# Patient Record
Sex: Female | Born: 1985 | Race: White | Hispanic: No | Marital: Single | Smoking: Current every day smoker
Health system: Southern US, Community
[De-identification: ages and names within clinical notes are randomized; demographics above are authoritative.]

## PROBLEM LIST (undated history)

## (undated) DIAGNOSIS — R569 Unspecified convulsions: Secondary | ICD-10-CM

## (undated) DIAGNOSIS — D649 Anemia, unspecified: Secondary | ICD-10-CM

## (undated) DIAGNOSIS — Z789 Other specified health status: Secondary | ICD-10-CM

## (undated) HISTORY — PX: WISDOM TOOTH EXTRACTION: SHX21

---
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
## 2005-08-18 ENCOUNTER — Emergency Department (HOSPITAL_COMMUNITY): Admission: EM | Admit: 2005-08-18 | Discharge: 2005-08-18 | Payer: Self-pay | Admitting: Emergency Medicine

## 2006-03-30 ENCOUNTER — Emergency Department (HOSPITAL_COMMUNITY): Admission: EM | Admit: 2006-03-30 | Discharge: 2006-03-30 | Payer: Self-pay | Admitting: Emergency Medicine

## 2007-05-20 ENCOUNTER — Ambulatory Visit: Payer: Self-pay | Admitting: Obstetrics & Gynecology

## 2007-05-20 ENCOUNTER — Inpatient Hospital Stay (HOSPITAL_COMMUNITY): Admission: AD | Admit: 2007-05-20 | Discharge: 2007-05-23 | Payer: Self-pay | Admitting: Obstetrics and Gynecology

## 2007-05-21 ENCOUNTER — Encounter: Payer: Self-pay | Admitting: Obstetrics & Gynecology

## 2008-02-20 ENCOUNTER — Emergency Department (HOSPITAL_COMMUNITY): Admission: EM | Admit: 2008-02-20 | Discharge: 2008-02-20 | Payer: Self-pay | Admitting: Emergency Medicine

## 2009-05-26 ENCOUNTER — Emergency Department (HOSPITAL_COMMUNITY): Admission: EM | Admit: 2009-05-26 | Discharge: 2009-05-26 | Payer: Self-pay | Admitting: Emergency Medicine

## 2011-02-24 NOTE — Discharge Summary (Signed)
[name], [name]              ACCOUNT NO.:  [id number]   MEDICAL RECORD NO.:  [id number]          PATIENT TYPE:  INP   LOCATION:  9122                          FACILITY:  [facility]   PHYSICIAN:  Tilda Burrow, M.D. DATE OF BIRTH:  30-Nov-1985   DATE OF ADMISSION:  05/20/2007  DATE OF DISCHARGE:  05/23/2007                               DISCHARGE SUMMARY   ADMITTING DIAGNOSES:  1. Pregnancy at 40 plus 4 weeks.  2. Medically indicated induction of labor.   DISCHARGE DIAGNOSES:  1. Pregnancy 40 weeks.  2. Failure to progress.  3. Asynclitic fetal presentation.   PROCEDURE:  1. May 20, 2007, Foley bulb cervical ripening, Jannifer Franklin, M.D.  2. May 21, 2007, Pitocin induction of labor.  3. May 21, 2007, primary low-transverse cervical cesarean section,      Lesly Dukes, M.D., teaching service.   DISCHARGE MEDICATIONS:  1. Vicodin 5/500, #20, one to two q.4 h. p.r.n. pain.  2. Chromagen forte, #60 tablets, one b.i.d. for 30 days.  3. Motrin 800 mg, #30 tablets, one q.8 h. p.r.n. cramps, refill x1.  4. Prenatal vitamins to be continued.   CONTRACEPTION:  Implanon at 4 weeks.   HOSPITAL SUMMARY:  [name] was admitted at 40 weeks plus 4 for induction  of labor.  The cervix was relatively unfavorable but Foley bulb cervical  ripening could be done.  Stretching cervix to 2-to-3-cm 20%, minus 2.  That was left in through the night and Pitocin was begun at 3 a.m. after  the cervix had reached 2-to-3-cm, 70%, minus 2.  She progressed thorough  the day with very slow progress.  The cervix remained quite fibrotic.  Mechanical stretching was performed with cervical exams.  Unfortunately,  she reached into the evening hours she reached 6-cm dilated, 70%, minus  1 but had no progress beyond that.  She was felt to have asynclitic  presentation.  An amnioinfusion was attempted without success and the  patient began to have continued variable decelerations into the 70s and  80s  with the resumption of Pitocin at 8 p.m.  She was therefore taken to  cesarean section at approximately 9 p.m. by Dr. Penne Lash.  Postpartum course was straightforward and she had a postpartum  hemoglobin 9.5, hematocrit 27.1, compared to  admitting hemoglobin 10.4, and hematocrit 30.  She did excellently  postoperatively with minimal bleeding.  She was tolerating breast-  feeding, bottle supplementation.  She had a female infant.  And, she was  discharged in stable condition for staple removal in 4 days with  Implanon to be decided upon thereafter.      Tilda Burrow, M.D.  Electronically Signed     JVF/MEDQ  D:  05/23/2007  T:  05/23/2007  Job:  [id number]   cc:   [facility]

## 2011-02-24 NOTE — H&P (Signed)
[name], [name]              ACCOUNT NO.:  [id number]   MEDICAL RECORD NO.:  [id number]          PATIENT TYPE:  INP   LOCATION:  9174                          [facility]ACILITY:  [facility]   PHYSICIAN:  Tilda Burrow, M.D. DATE OF BIRTH:  May 24, 1986   DATE OF ADMISSION:  05/20/2007  DATE OF DISCHARGE:                              HISTORY & PHYSICAL   ADMISSION DIAGNOSES:  Pregnancy at 40 weeks and 4 days, medical  induction of labor indicated secondary to impending post-dates.   HISTORY OF PRESENT ILLNESS:  This 26 year old gravida 1, para 0, with  ultrasound in __________ , placing her due date at May 15, 2007, and  with the patient admitted for Foley bulb cervical ripening and Pitocin  induction of labor. The patient has been followed since [redacted] weeks  gestation, September 22, 2006 through our office with uncomplicated  prenatal course, appropriate weight gain. She is admitted for labor  management. She is aware that all the usual risks of labor can occur  with induced labor, including the need for emergency delivery or  assisted vaginal delivery.   PAST MEDICAL HISTORY:  Benign.   PAST SURGICAL HISTORY:  Negative.   ALLERGIES:  Negative.   SOCIAL HISTORY:  Single. Lives alone. Works at [employer]. Supportive  partner, [name]. This is also his first child.   PRENATAL LABORATORY DATA:  Blood type A negative. Urine drug screen  positive for THC. Rubella immunity present. Hemoglobin and hematocrit 13  and 35. Hepatitis, HIV, RPR, and Gonorrhea and Chlamydia all negative.  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  MS AFP normal. Hemoglobin 11 and hematocrit 32. One hour glucose  __________ 119 mg percent.   PHYSICAL EXAMINATION:  VITAL SIGNS:  Height 5'2. Weight 140. Fundal  height 35 to 36 cm. Estimated fetal weight 6 pounds. Cervix fingertip,  20%, minus 2, vertex with Foley bulb inserted at 10:30 a.m.   PLAN:  Foley bulb ripening for 3 hours,  Pitocin induction of labor. Good  prognosis for vaginal delivery.      Tilda Burrow, M.D.  Electronically Signed     JVF/MEDQ  D:  05/20/2007  T:  05/21/2007  Job:  [id number]

## 2011-02-24 NOTE — Op Note (Signed)
[name], [name]              ACCOUNT NO.:  [id number]   MEDICAL RECORD NO.:  [id number]          PATIENT TYPE:  INP   LOCATION:  9174                          [facility]ILITY:  [facility]   PHYSICIAN:  Lesly Dukes, M.D. DATE OF BIRTH:  10/31/1985   DATE OF PROCEDURE:  05/21/2007  DATE OF DISCHARGE:                               OPERATIVE REPORT   PREOPERATIVE DIAGNOSIS:  25 year old G1, P0 at term with  malpresentation, asynclitism and occiput posterior.  The patient had  failure to progress and unable to tolerate Pitocin.   POSTOPERATIVE DIAGNOSIS:  25 year old G1, P0 at term with  malpresentation, asynclitism and occiput posterior.  The patient had  failure to progress and unable to tolerate Pitocin.   OPERATION PERFORMED:  Primary low transverse cesarean section.   SURGEON:  Lesly Dukes, M.D.   ANESTHESIA:  Epidural.   PATHOLOGY:  Placenta to pathology.   ESTIMATED BLOOD LOSS:  800 mL.   COMPLICATIONS:  Mild uterine atony requiring methergine.   FINDINGS:  Viable female infant, Apgars 9 at one minute and 9 at five  minutes.  Vertex asynclitic, LOP.  Head not well engaged.  Grossly  normal uterus, ovaries and fallopian tubes.  Clear fluid.   DESCRIPTION OF PROCEDURE:  After informed consent was obtained, the  patient was taken to the operating room where epidural anesthesia was  found to be adequate.  The patient was placed in dorsal supine position  with a leftward tilt and prepared and draped in the normal sterile  fashion.  A Foley catheter was already in the bladder.  A Pfannenstiel  skin incision was made with a scalpel and carried down to the fascia.  The fascia was incised in the midline and extended bilaterally with the  Mayo scissors.  The superior and inferior aspects of the fascial  incision were grasped with Kocher clamps, tented up, and dissected up  sharply and bluntly to the underlying layers of rectus muscles.  The  rectus muscles were separated in the  midline.  The peritoneum was  entered bluntly, extended superiorly and inferiorly with good  visualization of the bladder.  The bladder blade was inserted.  The  bladder flap was created and the bladder blade was reinserted.  The  uterine was incised in a transverse fashion in the lower uterine  segment, extended bilaterally bluntly. Head delivered atraumatically and  the nose and mouth were suctioned.  Rest of baby's body delivered  easily.  Cord was clamped and cut and the baby was handed to waiting  pediatrician.  Placenta delivered manually and had a three vessel cord.  The patient is a cord blood donor.  The uterus was found to be atonic  and was exteriorized and vigorously massaged.  Methergine and Pitocin  were given.  The uterus contracted adequately.  The uterine incision was  closed with 0-0 Vicryl in a running locked fashion.  A second suture of  0-0 Vicryl was used to reinforced the incision and aid in hemostasis.  The uterus was noted to be hemostatic off tension.  The peritoneal  cavity was copiously irrigated  and all layers were found to be  hemostatic.  The fascia was closed with 0-0 Vicryl in a running fashion.  Good hemostasis was noted.  Subcutaneous tissue was copiously  irrigated and found to be hemostatic.  Skin was closed with staples.  The patient tolerated the procedure well.  Sponge, lap, needle and  instruments were all correct times two.  The patient went to recovery  room in stable condition.      Lesly Dukes, M.D.  Electronically Signed     KHL/MEDQ  D:  05/21/2007  T:  05/23/2007  Job:  [id number]

## 2011-07-27 LAB — CBC
Hemoglobin: 9.5 — ABNORMAL LOW
MCHC: 34.6
RBC: 3.22 — ABNORMAL LOW
WBC: 8.5

## 2011-07-27 LAB — RH IMMUNE GLOB WKUP(>/=20WKS)(NOT WOMEN'S HOSP): Fetal Screen: NEGATIVE

## 2011-07-27 LAB — ANTIBODY SCREEN: Antibody Screen: NEGATIVE

## 2011-10-19 ENCOUNTER — Other Ambulatory Visit: Payer: Self-pay | Admitting: Adult Health

## 2011-10-19 ENCOUNTER — Other Ambulatory Visit (HOSPITAL_COMMUNITY)
Admission: RE | Admit: 2011-10-19 | Discharge: 2011-10-19 | Disposition: A | Discharge status: Home / Self Care | Payer: Self-pay | Source: Ambulatory Visit | Attending: Obstetrics and Gynecology | Admitting: Obstetrics and Gynecology

## 2011-10-19 DIAGNOSIS — Z113 Encounter for screening for infections with a predominantly sexual mode of transmission: Secondary | ICD-10-CM | POA: Insufficient documentation

## 2011-10-19 DIAGNOSIS — Z01419 Encounter for gynecological examination (general) (routine) without abnormal findings: Secondary | ICD-10-CM | POA: Insufficient documentation

## 2011-10-19 LAB — OB RESULTS CONSOLE ABO/RH: RH Type: NEGATIVE

## 2012-02-08 LAB — OB RESULTS CONSOLE HIV ANTIBODY (ROUTINE TESTING): HIV: NONREACTIVE

## 2012-04-21 ENCOUNTER — Encounter (HOSPITAL_COMMUNITY): Payer: Self-pay

## 2012-04-21 ENCOUNTER — Encounter (HOSPITAL_COMMUNITY)
Admission: RE | Admit: 2012-04-21 | Discharge: 2012-04-21 | Disposition: A | Discharge status: Home / Self Care | Payer: BC Managed Care – PPO | Source: Ambulatory Visit | Attending: Obstetrics & Gynecology | Admitting: Obstetrics & Gynecology

## 2012-04-21 HISTORY — DX: Other specified health status: Z78.9

## 2012-04-21 LAB — SURGICAL PCR SCREEN
MRSA, PCR: NEGATIVE
Staphylococcus aureus: NEGATIVE

## 2012-04-21 LAB — TYPE AND SCREEN
ABO/RH(D): A NEG
Antibody Screen: POSITIVE
DAT, IgG: NEGATIVE

## 2012-04-21 LAB — CBC
MCHC: 34 g/dL (ref 30.0–36.0)
RDW: 13.2 % (ref 11.5–15.5)

## 2012-04-21 NOTE — Patient Instructions (Addendum)
   Your procedure is scheduled on: Friday, July 19th  Enter through the [employer] [facility] at: [employer] up the phone at the desk and dial [phone] and inform us of your arrival.  Please call this number if you have any problems the morning of surgery: [phone]  Remember: Do not eat food after midnight: Thursday Do not drink clear liquids after: Thursday Take these medicines the morning of surgery with a SIP OF WATER: None  Do not wear jewelry, make-up, or FINGER nail polish No metal in your hair or on your body. Do not wear lotions, powders, perfumes or deodorant. Do not shave 48 hours prior to surgery. Do not bring valuables to the hospital. Contacts, dentures or bridgework may not be worn into surgery.  Leave suitcase in the car. After Surgery it may be brought to your room. For patients being admitted to the hospital, checkout time is 11:00am the day of discharge.  Home with mother Faylene Kurtz  cell [phone].  Patients discharged on the day of surgery will not be allowed to drive home.     Remember to use your hibiclens as instructed.Please shower with 1/2 bottle the evening before your surgery and the other 1/2 bottle the morning of surgery. Neck down avoiding private area.

## 2012-04-26 ENCOUNTER — Encounter (HOSPITAL_COMMUNITY): Payer: Self-pay

## 2012-04-29 ENCOUNTER — Encounter (HOSPITAL_COMMUNITY): Payer: Self-pay | Admitting: Anesthesiology

## 2012-04-29 ENCOUNTER — Encounter (HOSPITAL_COMMUNITY): Payer: Self-pay | Admitting: *Deleted

## 2012-04-29 ENCOUNTER — Inpatient Hospital Stay (HOSPITAL_COMMUNITY): Payer: BC Managed Care – PPO | Admitting: Anesthesiology

## 2012-04-29 ENCOUNTER — Other Ambulatory Visit: Payer: Self-pay | Admitting: Obstetrics & Gynecology

## 2012-04-29 ENCOUNTER — Encounter (HOSPITAL_COMMUNITY)
Admission: RE | Disposition: A | Discharge status: Home / Self Care | Payer: Self-pay | Source: Ambulatory Visit | Attending: Obstetrics & Gynecology

## 2012-04-29 ENCOUNTER — Inpatient Hospital Stay (HOSPITAL_COMMUNITY)
Admission: RE | Admit: 2012-04-29 | Discharge: 2012-05-01 | DRG: 371 | Disposition: A | Discharge status: Home / Self Care | Payer: BC Managed Care – PPO | Source: Ambulatory Visit | Attending: Obstetrics & Gynecology | Admitting: Obstetrics & Gynecology

## 2012-04-29 DIAGNOSIS — O34219 Maternal care for unspecified type scar from previous cesarean delivery: Secondary | ICD-10-CM

## 2012-04-29 LAB — TYPE AND SCREEN: ABO/RH(D): A NEG

## 2012-04-29 SURGERY — Surgical Case
Anesthesia: Spinal | Wound class: Clean Contaminated

## 2012-04-29 MED ORDER — FENTANYL CITRATE 0.05 MG/ML IJ SOLN
INTRAMUSCULAR | Status: AC
  Filled 2012-04-29: qty 2

## 2012-04-29 MED ORDER — MENTHOL 3 MG MT LOZG: 1.0000 | LOZENGE | OROMUCOSAL | Status: DC | PRN

## 2012-04-29 MED ORDER — IBUPROFEN 600 MG PO TABS
600.0000 mg | ORAL_TABLET | Freq: Four times a day (QID) | ORAL | Status: DC | PRN
  Filled 2012-04-29 (×5): qty 1

## 2012-04-29 MED ORDER — SCOPOLAMINE 1 MG/3DAYS TD PT72
1.0000 | MEDICATED_PATCH | Freq: Once | TRANSDERMAL | Status: DC
  Filled 2012-04-29: qty 1

## 2012-04-29 MED ORDER — OXYCODONE-ACETAMINOPHEN 5-325 MG PO TABS
1.0000 | ORAL_TABLET | ORAL | Status: DC | PRN
  Administered 2012-04-29 – 2012-04-30 (×5): 1 via ORAL
  Administered 2012-04-30: 2 via ORAL
  Administered 2012-04-30: 1 via ORAL
  Administered 2012-05-01: 2 via ORAL
  Administered 2012-05-01: 1 via ORAL
  Filled 2012-04-29 (×3): qty 1
  Filled 2012-04-29: qty 2
  Filled 2012-04-29 (×3): qty 1
  Filled 2012-04-29: qty 2
  Filled 2012-04-29: qty 1

## 2012-04-29 MED ORDER — ONDANSETRON HCL 4 MG/2ML IJ SOLN
INTRAMUSCULAR | Status: AC
  Filled 2012-04-29: qty 2

## 2012-04-29 MED ORDER — OXYTOCIN 10 UNIT/ML IJ SOLN
INTRAMUSCULAR | Status: AC
  Filled 2012-04-29: qty 6

## 2012-04-29 MED ORDER — WITCH HAZEL-GLYCERIN EX PADS: 1.0000 "application " | MEDICATED_PAD | CUTANEOUS | Status: DC | PRN

## 2012-04-29 MED ORDER — SODIUM CHLORIDE 0.9 % IJ SOLN: 3.0000 mL | INTRAMUSCULAR | Status: DC | PRN

## 2012-04-29 MED ORDER — NALOXONE HCL 0.4 MG/ML IJ SOLN: 0.4000 mg | INTRAMUSCULAR | Status: DC | PRN

## 2012-04-29 MED ORDER — DIBUCAINE 1 % RE OINT: 1.0000 "application " | TOPICAL_OINTMENT | RECTAL | Status: DC | PRN

## 2012-04-29 MED ORDER — DIPHENHYDRAMINE HCL 25 MG PO CAPS: 25.0000 mg | ORAL_CAPSULE | Freq: Four times a day (QID) | ORAL | Status: DC | PRN

## 2012-04-29 MED ORDER — KETOROLAC TROMETHAMINE 60 MG/2ML IM SOLN
60.0000 mg | Freq: Once | INTRAMUSCULAR | Status: AC | PRN
  Filled 2012-04-29: qty 2

## 2012-04-29 MED ORDER — OXYTOCIN 10 UNIT/ML IJ SOLN
INTRAMUSCULAR | Status: AC
  Filled 2012-04-29: qty 4

## 2012-04-29 MED ORDER — ONDANSETRON HCL 4 MG/2ML IJ SOLN
INTRAMUSCULAR | Status: DC | PRN
  Administered 2012-04-29: 4 mg via INTRAVENOUS

## 2012-04-29 MED ORDER — LACTATED RINGERS IV SOLN
INTRAVENOUS | Status: DC
  Administered 2012-04-29 (×5): via INTRAVENOUS

## 2012-04-29 MED ORDER — DIPHENHYDRAMINE HCL 25 MG PO CAPS: 25.0000 mg | ORAL_CAPSULE | ORAL | Status: DC | PRN

## 2012-04-29 MED ORDER — SIMETHICONE 80 MG PO CHEW: 80.0000 mg | CHEWABLE_TABLET | ORAL | Status: DC | PRN

## 2012-04-29 MED ORDER — CEFAZOLIN SODIUM-DEXTROSE 2-3 GM-% IV SOLR
INTRAVENOUS | Status: AC
  Filled 2012-04-29: qty 50

## 2012-04-29 MED ORDER — CEFAZOLIN SODIUM-DEXTROSE 2-3 GM-% IV SOLR
2.0000 g | INTRAVENOUS | Status: AC
  Administered 2012-04-29: 2 g via INTRAVENOUS

## 2012-04-29 MED ORDER — MORPHINE SULFATE 0.5 MG/ML IJ SOLN
INTRAMUSCULAR | Status: AC
  Filled 2012-04-29: qty 10

## 2012-04-29 MED ORDER — KETOROLAC TROMETHAMINE 30 MG/ML IJ SOLN
30.0000 mg | Freq: Four times a day (QID) | INTRAMUSCULAR | Status: AC | PRN
  Administered 2012-04-29: 30 mg via INTRAVENOUS
  Filled 2012-04-29: qty 1

## 2012-04-29 MED ORDER — DIPHENHYDRAMINE HCL 50 MG/ML IJ SOLN: 12.5000 mg | INTRAMUSCULAR | Status: DC | PRN

## 2012-04-29 MED ORDER — IBUPROFEN 600 MG PO TABS
600.0000 mg | ORAL_TABLET | Freq: Four times a day (QID) | ORAL | Status: DC
  Administered 2012-04-29 – 2012-05-01 (×7): 600 mg via ORAL
  Filled 2012-04-29: qty 1

## 2012-04-29 MED ORDER — SIMETHICONE 80 MG PO CHEW
80.0000 mg | CHEWABLE_TABLET | Freq: Three times a day (TID) | ORAL | Status: DC
  Administered 2012-04-29 – 2012-04-30 (×7): 80 mg via ORAL

## 2012-04-29 MED ORDER — PRENATAL MULTIVITAMIN CH
1.0000 | ORAL_TABLET | Freq: Every day | ORAL | Status: DC
  Administered 2012-04-30: 1 via ORAL
  Filled 2012-04-29: qty 1

## 2012-04-29 MED ORDER — 0.9 % SODIUM CHLORIDE (POUR BTL) OPTIME
TOPICAL | Status: DC | PRN
  Administered 2012-04-29: 1000 mL

## 2012-04-29 MED ORDER — OXYTOCIN 10 UNIT/ML IJ SOLN
40.0000 [IU] | INTRAVENOUS | Status: DC | PRN
  Administered 2012-04-29: 40 [IU] via INTRAVENOUS

## 2012-04-29 MED ORDER — ONDANSETRON HCL 4 MG/2ML IJ SOLN: 4.0000 mg | INTRAMUSCULAR | Status: DC | PRN

## 2012-04-29 MED ORDER — ONDANSETRON HCL 4 MG/2ML IJ SOLN: 4.0000 mg | Freq: Three times a day (TID) | INTRAMUSCULAR | Status: DC | PRN

## 2012-04-29 MED ORDER — METOCLOPRAMIDE HCL 5 MG/ML IJ SOLN: 10.0000 mg | Freq: Three times a day (TID) | INTRAMUSCULAR | Status: DC | PRN

## 2012-04-29 MED ORDER — EPHEDRINE 5 MG/ML INJ
INTRAVENOUS | Status: AC
  Filled 2012-04-29: qty 10

## 2012-04-29 MED ORDER — NALBUPHINE HCL 10 MG/ML IJ SOLN
5.0000 mg | INTRAMUSCULAR | Status: DC | PRN
  Filled 2012-04-29: qty 1

## 2012-04-29 MED ORDER — ONDANSETRON HCL 4 MG PO TABS: 4.0000 mg | ORAL_TABLET | ORAL | Status: DC | PRN

## 2012-04-29 MED ORDER — HYDROMORPHONE HCL PF 1 MG/ML IJ SOLN: 0.2500 mg | INTRAMUSCULAR | Status: DC | PRN

## 2012-04-29 MED ORDER — DIPHENHYDRAMINE HCL 50 MG/ML IJ SOLN: 25.0000 mg | INTRAMUSCULAR | Status: DC | PRN

## 2012-04-29 MED ORDER — SODIUM CHLORIDE 0.9 % IV SOLN
1.0000 ug/kg/h | INTRAVENOUS | Status: DC | PRN
  Filled 2012-04-29: qty 2.5

## 2012-04-29 MED ORDER — OXYTOCIN 40 UNITS IN LACTATED RINGERS INFUSION - SIMPLE MED: 62.5000 mL/h | INTRAVENOUS | Status: AC

## 2012-04-29 MED ORDER — MEPERIDINE HCL 25 MG/ML IJ SOLN: 6.2500 mg | INTRAMUSCULAR | Status: DC | PRN

## 2012-04-29 MED ORDER — SCOPOLAMINE 1 MG/3DAYS TD PT72
MEDICATED_PATCH | TRANSDERMAL | Status: AC
  Administered 2012-04-29: 1.5 mg via TRANSDERMAL
  Filled 2012-04-29: qty 1

## 2012-04-29 MED ORDER — FENTANYL CITRATE 0.05 MG/ML IJ SOLN
INTRAMUSCULAR | Status: DC | PRN
  Administered 2012-04-29: 25 ug via INTRATHECAL

## 2012-04-29 MED ORDER — SCOPOLAMINE 1 MG/3DAYS TD PT72
1.0000 | MEDICATED_PATCH | Freq: Once | TRANSDERMAL | Status: AC
  Administered 2012-04-29: 1 via TRANSDERMAL
  Administered 2012-04-29: 1.5 mg via TRANSDERMAL

## 2012-04-29 MED ORDER — EPHEDRINE SULFATE 50 MG/ML IJ SOLN
INTRAMUSCULAR | Status: DC | PRN
  Administered 2012-04-29 (×4): 10 mg via INTRAVENOUS

## 2012-04-29 MED ORDER — TETANUS-DIPHTH-ACELL PERTUSSIS 5-2.5-18.5 LF-MCG/0.5 IM SUSP
0.5000 mL | Freq: Once | INTRAMUSCULAR | Status: AC
  Administered 2012-04-30: 0.5 mL via INTRAMUSCULAR
  Filled 2012-04-29: qty 0.5

## 2012-04-29 MED ORDER — MORPHINE SULFATE (PF) 0.5 MG/ML IJ SOLN
INTRAMUSCULAR | Status: DC | PRN
  Administered 2012-04-29: .15 mg via EPIDURAL

## 2012-04-29 MED ORDER — SENNOSIDES-DOCUSATE SODIUM 8.6-50 MG PO TABS
2.0000 | ORAL_TABLET | Freq: Every day | ORAL | Status: DC
  Administered 2012-04-29 – 2012-04-30 (×2): 2 via ORAL

## 2012-04-29 MED ORDER — LANOLIN HYDROUS EX OINT: 1.0000 "application " | TOPICAL_OINTMENT | CUTANEOUS | Status: DC | PRN

## 2012-04-29 MED ORDER — ZOLPIDEM TARTRATE 5 MG PO TABS: 5.0000 mg | ORAL_TABLET | Freq: Every evening | ORAL | Status: DC | PRN

## 2012-04-29 MED ORDER — KETOROLAC TROMETHAMINE 30 MG/ML IJ SOLN: 30.0000 mg | Freq: Four times a day (QID) | INTRAMUSCULAR | Status: AC | PRN

## 2012-04-29 MED ORDER — LACTATED RINGERS IV SOLN
INTRAVENOUS | Status: DC
  Administered 2012-04-29: 16:00:00 via INTRAVENOUS

## 2012-04-29 SURGICAL SUPPLY — 36 items
ADH SKN CLS APL DERMABOND .7 (GAUZE/BANDAGES/DRESSINGS) ×2
ADH SKN CLS LQ APL DERMABOND (GAUZE/BANDAGES/DRESSINGS) ×1
CLOTH BEACON ORANGE TIMEOUT ST (SAFETY) ×2 IMPLANT
DERMABOND ADHESIVE PROPEN (GAUZE/BANDAGES/DRESSINGS) ×1
DERMABOND ADVANCED (GAUZE/BANDAGES/DRESSINGS) ×2
DERMABOND ADVANCED .7 DNX12 (GAUZE/BANDAGES/DRESSINGS) ×2 IMPLANT
DERMABOND ADVANCED .7 DNX6 (GAUZE/BANDAGES/DRESSINGS) IMPLANT
DURAPREP 26ML APPLICATOR (WOUND CARE) ×4 IMPLANT
ELECT REM PT RETURN 9FT ADLT (ELECTROSURGICAL) ×2
ELECTRODE REM PT RTRN 9FT ADLT (ELECTROSURGICAL) ×1 IMPLANT
EXTRACTOR VACUUM BELL STYLE (SUCTIONS) IMPLANT
GLOVE BIOGEL PI IND STRL 8 (GLOVE) ×2 IMPLANT
GLOVE BIOGEL PI INDICATOR 8 (GLOVE) ×2
GLOVE ECLIPSE 6.0 STRL STRAW (GLOVE) ×2 IMPLANT
GLOVE ECLIPSE 8.0 STRL XLNG CF (GLOVE) ×2 IMPLANT
GLOVE INDICATOR 6.5 STRL GRN (GLOVE) ×1 IMPLANT
KIT ABG SYR 3ML LUER SLIP (SYRINGE) ×2 IMPLANT
NDL HYPO 25X5/8 SAFETYGLIDE (NEEDLE) ×1 IMPLANT
NEEDLE HYPO 25X5/8 SAFETYGLIDE (NEEDLE) ×2 IMPLANT
NS IRRIG 1000ML POUR BTL (IV SOLUTION) ×2 IMPLANT
PACK C SECTION WH (CUSTOM PROCEDURE TRAY) ×2 IMPLANT
RTRCTR C-SECT PINK 25CM LRG (MISCELLANEOUS) IMPLANT
SLEEVE SCD COMPRESS KNEE MED (MISCELLANEOUS) IMPLANT
STAPLER VISISTAT 35W (STAPLE) IMPLANT
SUT CHROMIC 0 CT 1 (SUTURE) ×2 IMPLANT
SUT MNCRL 0 VIOLET CTX 36 (SUTURE) ×2 IMPLANT
SUT MONOCRYL 0 CTX 36 (SUTURE) ×2
SUT PLAIN 2 0 (SUTURE)
SUT PLAIN 2 0 XLH (SUTURE) IMPLANT
SUT PLAIN ABS 2-0 CT1 27XMFL (SUTURE) IMPLANT
SUT VIC AB 0 CTX 36 (SUTURE) ×2
SUT VIC AB 0 CTX36XBRD ANBCTRL (SUTURE) ×1 IMPLANT
SUT VIC AB 4-0 KS 27 (SUTURE) IMPLANT
TOWEL OR 17X24 6PK STRL BLUE (TOWEL DISPOSABLE) ×4 IMPLANT
TRAY FOLEY CATH 14FR (SET/KITS/TRAYS/PACK) ×1 IMPLANT
WATER STERILE IRR 1000ML POUR (IV SOLUTION) ×2 IMPLANT

## 2012-04-29 NOTE — H&P (Signed)
[name] is an 26 y.o. female with EDD 05/06/2012 by 11 week sonogram due to irregular periods, patient only came back after first 2 visits and gave questionable LMP of 10/26.  As a result we used her 11 week sonogram EDD of 05/06/2012 putting her now at 39 weeks and 1 day.  Her integrated screening was abnormal for Trisomy but Harmony test was normal.  Due to her PAPP-A being significantly low we did twice weekly antepartum fetal testing and monthly sonograms all of which are normal.  She declines  TOLAC and as a result will proceed withy repeat Caesarean section.   OB History    Grav Para Term Preterm Abortions TAB SAB Ect Mult Living   2 1 1       1      Menstrual History: Menarche age: 6 Patient's last menstrual period was 08/06/2011.  Patient actually has irregular periods and has an unsure LMP, her dating is based on an 11 week sonogram.    Past Medical History  Diagnosis Date  . No pertinent past medical history     Past Surgical History  Procedure Date  . Cesarean section 2008  . Wisdom tooth extraction     No family history on file.  Social History:  reports that she quit smoking about 5 months ago. Her smoking use included Cigarettes. She has a 13 pack-year smoking history. She has never used smokeless tobacco. She reports that she uses illicit drugs (Marijuana). She reports that she does not drink alcohol.  Allergies:  Allergies  Allergen Reactions  . Hydrocodone Itching and Nausea And Vomiting    Prescriptions prior to admission  Medication Sig Dispense Refill  . Pediatric Multivit-Minerals-C (FLINTSTONES GUMMIES PO) Take 1 strip by mouth daily.        ROS  Review of Systems  Constitutional: Negative for fever, chills, weight loss, malaise/fatigue and diaphoresis.  HENT: Negative for hearing loss, ear pain, nosebleeds, congestion, sore throat, neck pain, tinnitus and ear discharge.   Eyes: Negative for blurred vision, double vision, photophobia, pain,  discharge and redness.  Respiratory: Negative for cough, hemoptysis, sputum production, shortness of breath, wheezing and stridor.   Cardiovascular: Negative for chest pain, palpitations, orthopnea, claudication, leg swelling and PND.  Gastrointestinal:  Negative for abdominal pain. Negative for heartburn, nausea, vomiting, diarrhea, constipation, blood in stool and melena.  Genitourinary: Negative for dysuria, urgency, frequency, hematuria and flank pain.  Musculoskeletal: Negative for myalgias, back pain, joint pain and falls.  Skin: Negative for itching and rash.  Neurological: Negative for dizziness, tingling, tremors, sensory change, speech change, focal weakness, seizures, loss of consciousness, weakness and headaches.  Endo/Heme/Allergies: Negative for environmental allergies and polydipsia. Does not bruise/bleed easily.  Psychiatric/Behavioral: Negative for depression, suicidal ideas, hallucinations, memory loss and substance abuse. The patient is not nervous/anxious and does not have insomnia.      Blood pressure 127/75, pulse 79, temperature 98.2 F (36.8 C), temperature source Oral, resp. rate 18, last menstrual period 08/06/2011, SpO2 99.00%. Physical Exam Physical Exam  Vitals reviewed. Constitutional: She is oriented to person, place, and time. She appears well-developed and well-nourished.  HENT:  Head: Normocephalic and atraumatic.  Right Ear: External ear normal.  Left Ear: External ear normal.  Nose: Nose normal.  Mouth/Throat: Oropharynx is clear and moist.  Eyes: Conjunctivae and EOM are normal. Pupils are equal, round, and reactive to light. Right eye exhibits no discharge. Left eye exhibits no discharge. No scleral icterus.  Neck: Normal range of  motion. Neck supple. No tracheal deviation present. No thyromegaly present.  Cardiovascular: Normal rate, regular rhythm, normal heart sounds and intact distal pulses.  Exam reveals no gallop and no friction rub.   No murmur  heard. Respiratory: Effort normal and breath sounds normal. No respiratory distress. She has no wheezes. She has no rales. She exhibits no tenderness.  GI: Soft. Bowel sounds are normal. She exhibits no distension and no mass. There is tenderness. There is no rebound and no guarding.  Genitourinary:       Vulva is normal without lesions Vagina is pink moist without discharge Cervix normal in appearance and pap is normal Uterus is appropriate for gestational age. Adnexa is negative with normal sized ovaries by sonogram  Musculoskeletal: Normal range of motion. She exhibits no edema and no tenderness.  Neurological: She is alert and oriented to person, place, and time. She has normal reflexes. She displays normal reflexes. No cranial nerve deficit. She exhibits normal muscle tone. Coordination normal.  Skin: Skin is warm and dry. No rash noted. No erythema. No pallor.  Psychiatric: She has a normal mood and affect. Her behavior is normal. Judgment and thought content normal.   Recent Results (from the past 336 hour(s))  SURGICAL PCR SCREEN   Collection Time   04/21/12  9:20 AM      Component Value Range   MRSA, PCR NEGATIVE  NEGATIVE   Staphylococcus aureus NEGATIVE  NEGATIVE  RPR   Collection Time   04/21/12  9:20 AM      Component Value Range   RPR NON REACTIVE  NON REACTIVE  CBC   Collection Time   04/21/12  9:20 AM      Component Value Range   WBC 8.3  4.0 - 10.5 K/uL   RBC 3.34 (*) 3.87 - 5.11 MIL/uL   Hemoglobin 10.5 (*) 12.0 - 15.0 g/dL   HCT 16.1 (*) 09.6 - 04.5 %   MCV 92.5  78.0 - 100.0 fL   MCH 31.4  26.0 - 34.0 pg   MCHC 34.0  30.0 - 36.0 g/dL   RDW 40.9  81.1 - 91.4 %   Platelets 121 (*) 150 - 400 K/uL  TYPE AND SCREEN   Collection Time   04/21/12  9:20 AM      Component Value Range   ABO/RH(D) A NEG     Antibody Screen POS     Sample Expiration 04/24/2012     Antibody Identification PASSIVELY ACQUIRED ANTI-D     DAT, IgG NEG      Assessment/Plan: 1.  IUP at  39 1/[redacted] weeks gestation 2.  Previous section, declines repeat 3.  Elevated Trisomy 18 risk, normal Harmony, low PAPP-A  Proceed with repeat Caesarean section.  Patient understands risks benefits and indications  Hashem Goynes H 04/29/2012, 7:25 AM

## 2012-04-29 NOTE — Anesthesia Preprocedure Evaluation (Signed)
Anesthesia Evaluation  Patient identified by MRN, date of birth, ID band Patient awake    Reviewed: Allergy & Precautions, H&P , Patient's Chart, lab work & pertinent test results  Airway Mallampati: II TM Distance: >3 FB Neck ROM: full    Dental No notable dental hx.    Pulmonary  breath sounds clear to auscultation  Pulmonary exam normal       Cardiovascular Exercise Tolerance: Good Rhythm:regular Rate:Normal     Neuro/Psych    GI/Hepatic   Endo/Other    Renal/GU      Musculoskeletal   Abdominal   Peds  Hematology   Anesthesia Other Findings plts 121   Reproductive/Obstetrics                           Anesthesia Physical Anesthesia Plan  ASA: II  Anesthesia Plan: Spinal   Post-op Pain Management:    Induction:   Airway Management Planned:   Additional Equipment:   Intra-op Plan:   Post-operative Plan:   Informed Consent: I have reviewed the patients History and Physical, chart, labs and discussed the procedure including the risks, benefits and alternatives for the proposed anesthesia with the patient or authorized representative who has indicated his/her understanding and acceptance.   Dental Advisory Given  Plan Discussed with: CRNA  Anesthesia Plan Comments: (Lab work confirmed with CRNA in room. Platelets okay. Discussed spinal anesthetic, and patient consents to the procedure:  included risk of possible headache,backache, failed block, allergic reaction, and nerve injury. This patient was asked if she had any questions or concerns before the procedure started. )        Anesthesia Quick Evaluation

## 2012-04-29 NOTE — Op Note (Signed)
[name] PROCEDURE DATE: 04/29/2012  PREOPERATIVE DIAGNOSIS: Intrauterine pregnancy at  [redacted]w[redacted]d weeks gestation; elective repeat  POSTOPERATIVE DIAGNOSIS: The same  PROCEDURE: Repeat Low Transverse Cesarean Section  SURGEON:  Dr. Duane Lope  ASSISTANT: Dr Candelaria Celeste  INDICATIONS: [name] is a 25 y.o. G2P1001 at [redacted]w[redacted]d scheduled for cesarean section secondary to elective repeat.  The risks of cesarean section discussed with the patient included but were not limited to: bleeding which may require transfusion or reoperation; infection which may require antibiotics; injury to bowel, bladder, ureters or other surrounding organs; injury to the fetus; need for additional procedures including hysterectomy in the event of a life-threatening hemorrhage; placental abnormalities wth subsequent pregnancies, incisional problems, thromboembolic phenomenon and other postoperative/anesthesia complications. The patient concurred with the proposed plan, giving informed written consent for the procedure.    FINDINGS:  Viable female infant in cephalic presentation.  Apgars 8 and 9, weight pending.  Clear amniotic fluid.  Intact placenta, three vessel cord.  Normal uterus, fallopian tubes and ovaries bilaterally.  ANESTHESIA:    Spinal INTRAVENOUS FLUIDS:2900 ml ESTIMATED BLOOD LOSS: 800 ml URINE OUTPUT:  200 ml SPECIMENS: Placenta sent to pathology COMPLICATIONS: None immediate  PROCEDURE IN DETAIL:  The patient received intravenous antibiotics and had sequential compression devices applied to her lower extremities while in the preoperative area.  She was then taken to the operating room where spinal anesthesia was administered  and was found to be adequate. She was then placed in a dorsal supine position with a leftward tilt, and prepped and draped in a sterile manner.  A foley catheter was placed into her bladder and attached to constant gravity, which drained clear fluid throughout.  After an  adequate timeout was performed, a Pfannenstiel skin incision was made with scalpel and carried through to the underlying layer of fascia. The fascia was incised in the midline and this incision was extended bilaterally using the Mayo scissors. Kocher clamps were applied to the superior aspect of the fascial incision and the underlying rectus muscles were dissected off bluntly. A similar process was carried out on the inferior aspect of the facial incision. The rectus muscles were separated in the midline bluntly and the peritoneum was entered bluntly. Attention was turned to the lower uterine segment where a transverse hysterotomy was made with a scalpel and extended bilaterally bluntly. The bladder blade was then removed. The infant was successfully delivered, and cord was clamped and cut and infant was handed over to awaiting neonatology team. Uterine massage was then administered and the placenta delivered intact with three-vessel cord. The uterus was then cleared of clot and debris.  The hysterotomy was closed with 0 Monocryl in a running locked fashion. Overall, excellent hemostasis was noted. The abdomen and the pelvis were cleared of all clot and debris. Hemostasis was confirmed on all surfaces.  The rectus muscles and peritoneum was reapproximated using 0 Chromic running stitches. The fascia was then closed using 0 Vicryl in a running fashion.  The skin was closed with 4-0 Vicryl. The patient tolerated the procedure well. Sponge, lap, instrument and needle counts were correct x 2. She was taken to the recovery room in stable condition.    Candelaria Celeste JEHIEL DO 04/29/2012 8:41 AM

## 2012-04-29 NOTE — Anesthesia Postprocedure Evaluation (Signed)
  Anesthesia Post-op Note  Patient: [name]  Procedure(s) Performed: Procedure(s) (LRB): CESAREAN SECTION (N/A)  Patient is awake, responsive, moving her legs, and has signs of resolution of her numbness. Pain and nausea are reasonably well controlled. Vital signs are stable and clinically acceptable. Oxygen saturation is clinically acceptable. There are no apparent anesthetic complications at this time. Patient is ready for discharge.

## 2012-04-29 NOTE — Anesthesia Procedure Notes (Signed)
Spinal  Patient location during procedure: OR Preanesthetic Checklist Completed: patient identified, site marked, surgical consent, pre-op evaluation, timeout performed, IV checked, risks and benefits discussed and monitors and equipment checked Spinal Block Patient position: sitting Prep: DuraPrep Patient monitoring: heart rate, cardiac monitor, continuous pulse ox and blood pressure Approach: midline Location: L3-4 Injection technique: single-shot Needle Needle type: Sprotte  Needle gauge: 24 G Needle length: 9 cm Assessment Sensory level: T4 Events: paresthesia Additional Notes Repositioned after (-) asp CSF ; though it drips. 2nd pass, clear CSF and transient R leg parasthesia 3rd pass uneventful; (+) asp CSF Spinal Dosage in OR  Bupivicaine ml       1.4 PFMS04   mcg        150 Fentanyl mcg            25

## 2012-04-29 NOTE — Transfer of Care (Signed)
Immediate Anesthesia Transfer of Care Note  Patient: [name]  Procedure(s) Performed: Procedure(s) (LRB): CESAREAN SECTION (N/A)  Patient Location: PACU  Anesthesia Type: Spinal  Level of Consciousness: awake, alert  and oriented  Airway & Oxygen Therapy: Patient Spontanous Breathing  Post-op Assessment: Report given to PACU RN and Post -op Vital signs reviewed and stable  Post vital signs: Reviewed and stable  Complications: No apparent anesthesia complications

## 2012-04-30 LAB — CBC
MCH: 31.1 pg (ref 26.0–34.0)
MCV: 92.9 fL (ref 78.0–100.0)
Platelets: 87 10*3/uL — ABNORMAL LOW (ref 150–400)
RDW: 13.3 % (ref 11.5–15.5)
WBC: 6.5 10*3/uL (ref 4.0–10.5)

## 2012-04-30 NOTE — Progress Notes (Signed)
Clinical Social Work Department PSYCHOSOCIAL ASSESSMENT - MATERNAL/CHILD 04/30/2012  Patient:  [place]  Account Number:  [id number]  Admit Date:  04/29/2012  Childs Name:   [name]    Clinical Social Worker:  Eyana Stolze, LCSW   Date/Time:  04/30/2012 11:00 AM  Date Referred:  04/30/2012   Referral source  Physician     Referred reason  Substance Abuse   Other referral source:    I:  FAMILY / HOME ENVIRONMENT Child's legal guardian:  PARENT  Guardian - Name Guardian - Age Guardian - Address  Amberrose [address]ak [address] [place], [place] [address]  FOB incarcerated     Other household support members/support persons Name Relationship DOB  Amya Dalton SISTER 5yo   Other support:   MGM and lots of other family support per pt.    II  PSYCHOSOCIAL DATA Information Source:  Patient Interview  Financial and Community Resources Employment:   [employer]   Financial resources:  Medicaid If Medicaid - County:  [place]  School / Grade:   Maternity Care Coordinator / Child Services Coordination / Early Interventions:  Cultural issues impacting care:    III  STRENGTHS Strengths  Adequate Resources  Home prepared for Child (including basic supplies)  Supportive family/friends   Strength comment:    IV  RISK FACTORS AND CURRENT PROBLEMS Current Problem:  YES   Risk Factor & Current Problem Patient Issue Family Issue Risk Factor / Current Problem Comment  Substance Abuse Y N infant positvie for MJ    V  SOCIAL WORK ASSESSMENT CSW spoke with MOB at bedside alone in room.  Discsussed any emotional concerns, which MOB reports none.  Discussed symptoms to be aware of and to let RN or CSW know if any concerns arise.  Discussed family support and FOB's incarceration.  FOB incarcerated due to DUI issues and unpaid tickets.  FOB has been calling MOB due to him being upset he cannot be there for birth of infant.  MOB reports she has lots of family  support and her M is most supportive.  Disscussed supplies for infant, and MOB does not have any concerns obtaining supplies.  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 Discussed drug use and MOB reported she used as recent as a few days ago, and that use is not social its due to servere nausea during pregnancy.  MOB reports she has not had any involvement with DSS in the past.  CSW discussed hospital policy to report positive screens to CPS.  MOB was tearful yet understanding.  CSW spoke with [place] CPS worker who indicated she would be here later this evening or early tomorrow morning (7/21) to speak with MOB.  MOB asked that she be alone when DSS comes to speak with her. CSW will continue to follow and speak with CPS once they consult with MOB about discharge plan.      VI SOCIAL WORK PLAN Social Work Plan  Child Protective Services Report  Psychosocial Support/Ongoing Assessment of Needs   Type of pt/family education:   If child protective services report - county:  [place] If child protective services report - date:  04/30/2012 Information/referral to community resources comment:   Other social work plan:    

## 2012-04-30 NOTE — Progress Notes (Signed)
Subjective: Postpartum Day 1: Cesarean Delivery Patient reports incisional pain and no problems voiding.  Tolerating PO.  Objective: Vital signs in last 24 hours: Temp:  [97.3 F (36.3 C)-98.2 F (36.8 C)] 98.1 F (36.7 C) (07/20 0509) Pulse Rate:  [49-71] 53  (07/20 0509) Resp:  [16-20] 18  (07/20 0509) BP: (93-120)/(45-61) 93/51 mmHg (07/20 0509) SpO2:  [97 %-100 %] 97 % (07/19 2100) Weight:  [66.679 kg (147 lb)] 66.679 kg (147 lb) (07/19 1029)  Physical Exam:  General: alert, cooperative and no distress Lochia: appropriate Uterine Fundus: firm Incision: healing well, no significant drainage, no dehiscence, no significant erythema DVT Evaluation: No evidence of DVT seen on physical exam. Negative Homan's sign. No cords or calf tenderness. No significant calf/ankle edema.   Basename 04/30/12 0550  HGB 7.9*  HCT 23.6*    Assessment/Plan: Status post Cesarean section. Doing well postoperatively.  Continue current care.  STINSON, JACOB JEHIEL 04/30/2012, 7:25 AM

## 2012-05-01 MED ORDER — IBUPROFEN 600 MG PO TABS: 600.0000 mg | ORAL_TABLET | Freq: Four times a day (QID) | ORAL | Status: AC | PRN

## 2012-05-01 MED ORDER — RHO D IMMUNE GLOBULIN 1500 UNIT/2ML IJ SOLN
300.0000 ug | Freq: Once | INTRAMUSCULAR | Status: AC
  Administered 2012-05-01: 300 ug via INTRAMUSCULAR
  Filled 2012-05-01: qty 2

## 2012-05-01 MED ORDER — OXYCODONE-ACETAMINOPHEN 5-325 MG PO TABS: 1.0000 | ORAL_TABLET | ORAL | Status: AC | PRN

## 2012-05-01 NOTE — Progress Notes (Signed)
CSW spoke with DSS. MOB and infant okay for discharge and MOB has meeting with CPS worker at her home at 1pm today. RN aware and working on early discharge. Please reconsult CSW if any further needs arise.   

## 2012-05-01 NOTE — Discharge Summary (Signed)
Obstetric Discharge Summary Reason for Admission: cesarean section Prenatal Procedures: none Intrapartum Procedures: cesarean: low cervical, transverse Postpartum Procedures: none Complications-Operative and Postpartum: none Hemoglobin  Date Value Range Status  04/30/2012 7.9* 12.0 - 15.0 g/dL Final     HCT  Date Value Range Status  04/30/2012 23.6* 36.0 - 46.0 % Final    Physical Exam:  General: alert, cooperative and no distress Heart: RRR Lungs: nl effort Lochia: appropriate Uterine Fundus: firm Incision: healing well, no dehiscence DVT Evaluation: No evidence of DVT seen on physical exam.  Discharge Diagnoses: Term Pregnancy-delivered  Discharge Information: Date: 05/01/2012 Activity: pelvic rest Diet: routine Medications: PNV, Ibuprofen and Percocet Condition: stable Instructions: refer to practice specific booklet Discharge to: home Follow-up Information    Follow up with [facility]. (Make appointment for 4-6 weeks postpartum)    Contact information:   [address] [place] [address]          Newborn Data: Live born female  Birth Weight: 6 lb 9.8 oz (3000 g) APGAR: 9, 9  Home with mother. Bottlefeeding; declines contraception (FOB incarcerated). Referral to CPS (see SW note).  Cam Hai 05/01/2012, 7:47 AM

## 2012-05-02 ENCOUNTER — Encounter (HOSPITAL_COMMUNITY): Payer: Self-pay | Admitting: Obstetrics & Gynecology

## 2012-05-02 LAB — RH IG WORKUP (INCLUDES ABO/RH): Gestational Age(Wks): 39

## 2012-05-02 NOTE — Discharge Summary (Signed)
Attestation of Attending Supervision of Advanced Practitioner (CNM/NP): Evaluation and management procedures were performed by the Advanced Practitioner under my supervision and collaboration.  I have reviewed the Advanced Practitioner's note and chart, and I agree with the management and plan.  HARRAWAY-SMITH, Arali Somera 3:33 PM     

## 2014-08-13 ENCOUNTER — Encounter (HOSPITAL_COMMUNITY): Payer: Self-pay | Admitting: Obstetrics & Gynecology

## 2015-08-27 ENCOUNTER — Emergency Department (HOSPITAL_COMMUNITY)
Admission: EM | Admit: 2015-08-27 | Discharge: 2015-08-27 | Disposition: A | Discharge status: Home / Self Care | Payer: Medicaid Other | Attending: Emergency Medicine | Admitting: Emergency Medicine

## 2015-08-27 ENCOUNTER — Encounter (HOSPITAL_COMMUNITY): Payer: Self-pay

## 2015-08-27 ENCOUNTER — Emergency Department (HOSPITAL_COMMUNITY): Payer: Medicaid Other

## 2015-08-27 DIAGNOSIS — J3489 Other specified disorders of nose and nasal sinuses: Secondary | ICD-10-CM | POA: Insufficient documentation

## 2015-08-27 DIAGNOSIS — R05 Cough: Secondary | ICD-10-CM | POA: Insufficient documentation

## 2015-08-27 DIAGNOSIS — F17210 Nicotine dependence, cigarettes, uncomplicated: Secondary | ICD-10-CM | POA: Insufficient documentation

## 2015-08-27 DIAGNOSIS — Z862 Personal history of diseases of the blood and blood-forming organs and certain disorders involving the immune mechanism: Secondary | ICD-10-CM | POA: Insufficient documentation

## 2015-08-27 DIAGNOSIS — R067 Sneezing: Secondary | ICD-10-CM | POA: Insufficient documentation

## 2015-08-27 DIAGNOSIS — R059 Cough, unspecified: Secondary | ICD-10-CM

## 2015-08-27 DIAGNOSIS — R0982 Postnasal drip: Secondary | ICD-10-CM | POA: Insufficient documentation

## 2015-08-27 DIAGNOSIS — Z79899 Other long term (current) drug therapy: Secondary | ICD-10-CM | POA: Insufficient documentation

## 2015-08-27 DIAGNOSIS — J029 Acute pharyngitis, unspecified: Secondary | ICD-10-CM | POA: Insufficient documentation

## 2015-08-27 DIAGNOSIS — R0602 Shortness of breath: Secondary | ICD-10-CM | POA: Insufficient documentation

## 2015-08-27 HISTORY — DX: Anemia, unspecified: D64.9

## 2015-08-27 MED ORDER — AZITHROMYCIN 250 MG PO TABS: 250.0000 mg | ORAL_TABLET | Freq: Every day | ORAL | Status: DC

## 2015-08-27 MED ORDER — ALBUTEROL SULFATE HFA 108 (90 BASE) MCG/ACT IN AERS
2.0000 | INHALATION_SPRAY | Freq: Four times a day (QID) | RESPIRATORY_TRACT | Status: DC | PRN
  Administered 2015-08-27: 2 via RESPIRATORY_TRACT
  Filled 2015-08-27: qty 6.7

## 2015-08-27 MED ORDER — PREDNISONE 20 MG PO TABS: 40.0000 mg | ORAL_TABLET | Freq: Every day | ORAL | Status: DC

## 2015-08-27 NOTE — ED Provider Notes (Signed)
CSN: [id number]     Arrival date & time 08/27/15  1337 History   First MD Initiated Contact with Patient 08/27/15 1400     Chief Complaint  Patient presents with  . Cough     (Consider location/radiation/quality/duration/timing/severity/associated sxs/prior Treatment) HPI Comments: Patient presents to the emergency department with chief complaint of cough 2 months. She reports coughing up brown phlegm. She states that she is in everyday smoker. She reports some associated shortness breath, nasal congestion, and sore throat. She has not been seen by anyone for her symptoms. There are no aggravating or alleviating factors. She denies any chest pain.  The history is provided by the patient. No language interpreter was used.    Past Medical History  Diagnosis Date  . No pertinent past medical history   . Anemia    Past Surgical History  Procedure Laterality Date  . Cesarean section  2008  . Wisdom tooth extraction    . Cesarean section  04/29/2012    Procedure: CESAREAN SECTION;  Surgeon: Lazaro Arms, MD;  Location: [facility];  Service: Gynecology;  Laterality: N/A;  Dr. Despina Hidden will be doing case   History reviewed. No pertinent family history. Social History  Substance Use Topics  . Smoking status: Current Every Day Smoker -- 1.00 packs/day for 13 years    Types: Cigarettes    Last Attempt to Quit: 11/13/2011  . Smokeless tobacco: Never Used  . Alcohol Use: No   OB History    Gravida Para Term Preterm AB TAB SAB Ectopic Multiple Living   Review of Systems  Constitutional: Negative for fever and chills.  HENT: Positive for postnasal drip, rhinorrhea, sinus pressure, sneezing and sore throat.   Respiratory: Positive for cough. Negative for shortness of breath.   Cardiovascular: Negative for chest pain.  Gastrointestinal: Negative for nausea, vomiting, abdominal pain, diarrhea and constipation.  Genitourinary: Negative for dysuria.      Allergies   Hydrocodone  Home Medications   Prior to Admission medications   Medication Sig Start Date End Date Taking? Authorizing Provider  Pediatric Multivit-Minerals-C (FLINTSTONES GUMMIES PO) Take 1 strip by mouth daily.    Historical Provider, MD   BP 128/75 mmHg  Pulse 79  Temp(Src) 98.1 F (36.7 C) (Oral)  Resp 18  SpO2 100%  LMP 08/14/2015 Physical Exam  Constitutional: She is oriented to person, place, and time. She appears well-developed and well-nourished.  HENT:  Head: Normocephalic and atraumatic.  Eyes: Conjunctivae and EOM are normal. Pupils are equal, round, and reactive to light.  Neck: Normal range of motion. Neck supple.  Cardiovascular: Normal rate and regular rhythm.  Exam reveals no gallop and no friction rub.   No murmur heard. Pulmonary/Chest: Effort normal and breath sounds normal. No respiratory distress. She has no wheezes. She has no rales. She exhibits no tenderness.  Abdominal: Soft. Bowel sounds are normal. She exhibits no distension and no mass. There is no tenderness. There is no rebound and no guarding.  Musculoskeletal: Normal range of motion. She exhibits no edema or tenderness.  Neurological: She is alert and oriented to person, place, and time.  Skin: Skin is warm and dry.  Psychiatric: She has a normal mood and affect. Her behavior is normal. Judgment and thought content normal.  Nursing note and vitals reviewed.   ED Course  Procedures (including critical care time)  Imaging Review Dg Chest 2 View  08/27/2015  CLINICAL DATA:  [age] year old smoker with 3 week history of productive cough, headache, shortness of breath, fatigue and laryngitis. EXAM: CHEST  2 VIEW COMPARISON:  None. FINDINGS: Cardiomediastinal silhouette unremarkable. Lungs clear. Bronchovascular markings normal. Pulmonary vascularity normal. No pneumothorax. No pleural effusions. Visualized bony thorax intact apart from very slight thoracic scoliosis convex left. IMPRESSION: No acute or  significant abnormality. Electronically Signed   By: Hulan Saashomas  Lawrence M.D.   On: 08/27/2015 14:55   I have personally reviewed and evaluated these images and lab results as part of my medical decision-making.   MDM   Final diagnoses:  Cough    Patient with cough 2 months. Will treat with inhaler, prednisone, Z-Pak given smoking history. Recommend PCP follow-up.    Roxy Horsemanobert Ajax Schroll, PA-C 08/27/15 1533  Arby BarretteMarcy Pfeiffer, MD 09/11/15 [phone]

## 2015-08-27 NOTE — Discharge Instructions (Signed)

## 2015-08-27 NOTE — ED Notes (Signed)
Pt presents with 2 month h/o productive cough with brown phlegm, reports shortness of breath, nasal congestion and sore throat.  Pt has taken no medication.

## 2015-09-28 ENCOUNTER — Emergency Department (HOSPITAL_COMMUNITY): Payer: Medicaid Other

## 2015-09-28 ENCOUNTER — Emergency Department (HOSPITAL_COMMUNITY)
Admission: EM | Admit: 2015-09-28 | Discharge: 2015-09-28 | Disposition: A | Discharge status: Home / Self Care | Payer: Medicaid Other | Attending: Emergency Medicine | Admitting: Emergency Medicine

## 2015-09-28 ENCOUNTER — Encounter (HOSPITAL_COMMUNITY): Payer: Self-pay

## 2015-09-28 DIAGNOSIS — M25571 Pain in right ankle and joints of right foot: Secondary | ICD-10-CM

## 2015-09-28 DIAGNOSIS — Z862 Personal history of diseases of the blood and blood-forming organs and certain disorders involving the immune mechanism: Secondary | ICD-10-CM | POA: Insufficient documentation

## 2015-09-28 DIAGNOSIS — K0889 Other specified disorders of teeth and supporting structures: Secondary | ICD-10-CM | POA: Insufficient documentation

## 2015-09-28 DIAGNOSIS — Y990 Civilian activity done for income or pay: Secondary | ICD-10-CM | POA: Insufficient documentation

## 2015-09-28 DIAGNOSIS — F17210 Nicotine dependence, cigarettes, uncomplicated: Secondary | ICD-10-CM | POA: Insufficient documentation

## 2015-09-28 DIAGNOSIS — Z7952 Long term (current) use of systemic steroids: Secondary | ICD-10-CM | POA: Insufficient documentation

## 2015-09-28 DIAGNOSIS — S99911A Unspecified injury of right ankle, initial encounter: Secondary | ICD-10-CM | POA: Insufficient documentation

## 2015-09-28 DIAGNOSIS — K0381 Cracked tooth: Secondary | ICD-10-CM | POA: Insufficient documentation

## 2015-09-28 DIAGNOSIS — Z792 Long term (current) use of antibiotics: Secondary | ICD-10-CM | POA: Insufficient documentation

## 2015-09-28 DIAGNOSIS — Z98818 Other dental procedure status: Secondary | ICD-10-CM | POA: Insufficient documentation

## 2015-09-28 DIAGNOSIS — Y9289 Other specified places as the place of occurrence of the external cause: Secondary | ICD-10-CM | POA: Insufficient documentation

## 2015-09-28 DIAGNOSIS — W1843XA Slipping, tripping and stumbling without falling due to stepping from one level to another, initial encounter: Secondary | ICD-10-CM | POA: Insufficient documentation

## 2015-09-28 DIAGNOSIS — Y9389 Activity, other specified: Secondary | ICD-10-CM | POA: Insufficient documentation

## 2015-09-28 MED ORDER — IBUPROFEN 800 MG PO TABS: 800.0000 mg | ORAL_TABLET | Freq: Three times a day (TID) | ORAL | Status: DC | PRN

## 2015-09-28 NOTE — ED Provider Notes (Signed)
CSN: [id number]     Arrival date & time 09/28/15  1531 History  By signing my name below, I, Freida BusmanDiana Omoyeni, attest that this documentation has been prepared under the direction and in the presence of non-physician practitioner, Trixie DredgeEmily Lenny Bouchillon, PA-C. Electronically Signed: Freida Busmaniana Omoyeni, Scribe. 09/28/2015. 4:16 PM.    Chief Complaint  Patient presents with  . Dental Pain  . Ankle Pain     The history is provided by the patient. No language interpreter was used.   HPI Comments:  [name] is a 29 y.o. female who presents to the Emergency Department complaining of moderate right ankle pain for 2 days. Pt states she missed the last 2 steps night and twsited the ankle 2 days ago. She again injured the ankle at  work yesterday when her leg "gave out". No treatments tried PTA.  She also complains of "shooting" left upper dental pain, worse over the last 2 days. She denies fever, SOB, and difficulty swallowing. No alleviating factors noted. She reports a h/o multiple cavities and states she has not seen a dentist in ~ 3 years. Has noticed a hole in the left upper molar x several months.   Past Medical History  Diagnosis Date  . No pertinent past medical history   . Anemia    Past Surgical History  Procedure Laterality Date  . Cesarean section  2008  . Wisdom tooth extraction    . Cesarean section  04/29/2012    Procedure: CESAREAN SECTION;  Surgeon: Lazaro ArmsLuther H Eure, MD;  Location: [facility];  Service: Gynecology;  Laterality: N/A;  Dr. Despina HiddenEure will be doing case   No family history on file. Social History  Substance Use Topics  . Smoking status: Current Every Day Smoker -- 1.00 packs/day for 13 years    Types: Cigarettes    Last Attempt to Quit: 11/13/2011  . Smokeless tobacco: Never Used  . Alcohol Use: Yes     Comment: socially   OB History    Gravida Para Term Preterm AB TAB SAB Ectopic Multiple Living   2 2 2       2      Review of Systems  Constitutional: Negative for fever and  chills.  HENT: Positive for dental problem. Negative for sore throat and trouble swallowing.   Respiratory: Negative for shortness of breath.   Cardiovascular: Negative for chest pain and leg swelling.  Musculoskeletal: Positive for arthralgias.       Right ankle   Skin: Negative for color change and wound.  Allergic/Immunologic: Negative for immunocompromised state.  Neurological: Negative for headaches.  Psychiatric/Behavioral: Negative for self-injury (accidental ).   Allergies  Hydrocodone  Home Medications   Prior to Admission medications   Medication Sig Start Date End Date Taking? Authorizing Provider  azithromycin (ZITHROMAX Z-PAK) 250 MG tablet Take 1 tablet (250 mg total) by mouth daily. 500mg  PO day 1, then 250mg  PO days 205 08/27/15   Roxy Horsemanobert Browning, PA-C  predniSONE (DELTASONE) 20 MG tablet Take 2 tablets (40 mg total) by mouth daily. 08/27/15   Roxy Horsemanobert Browning, PA-C   BP 109/61 mmHg  Pulse 70  Temp(Src) 98 F (36.7 C) (Oral)  Resp 18  Ht 5\' 2"  (1.575 m)  Wt 124 lb (56.246 kg)  BMI 22.67 kg/m2  SpO2 99%  LMP 09/28/2015 Physical Exam  Constitutional: She appears well-developed and well-nourished. No distress.  HENT:  Head: Normocephalic and atraumatic.  Mouth/Throat: Uvula is midline and oropharynx is clear and moist. Mucous membranes  are not dry. No uvula swelling. No oropharyngeal exudate, posterior oropharyngeal edema, posterior oropharyngeal erythema or tonsillar abscesses.  Left upper second molar with fracture and tender to percussion; No abscess   Neck: Trachea normal, normal range of motion and phonation normal. Neck supple. No tracheal tenderness present. No rigidity. No tracheal deviation, no edema, no erythema and normal range of motion present.  Cardiovascular: Normal rate.   Pulmonary/Chest: Effort normal and breath sounds normal. No stridor.  Musculoskeletal:  Tenderness to right medial malleolus and inferior to medial malleolus   No other bony  tenderness, or edema. Sensation and pulses intact  No calf tenderness   Lymphadenopathy:    She has no cervical adenopathy.  Neurological: She is alert.  Skin: She is not diaphoretic.  Nursing note and vitals reviewed.   ED Course  Procedures  DIAGNOSTIC STUDIES:  Oxygen Saturation is 99% on RA, normal by my interpretation.    COORDINATION OF CARE:  4:05 PM Discussed treatment plan with pt at bedside and pt agreed to plan.  Labs Review Labs Reviewed - No data to display  Imaging Review No results found. I have personally reviewed and evaluated these images as part of my medical decision-making.    MDM   Final diagnoses:  Pain, dental  Right ankle pain   Afebrile, nontoxic patient with new dental pain.  Doubt abscess.  No concerning findings on exam.  Doubt deep space head or neck infection.  Doubt Ludwig's angina.  Also with right ankle injury two days ago. Likely sprain.  Neurovascularly intact.  Xray negative. D/C home with PCP and dental follow up.  Discussed findings, treatment, and follow up  with patient.  Pt given return precautions.  Pt verbalizes understanding and agrees with plan.       I personally performed the services described in this documentation, which was scribed in my presence. The recorded information has been reviewed and is accurate.    Trixie Dredge, PA-C 09/28/15 1806  Mancel Bale, MD 09/28/15 [phone]

## 2015-09-28 NOTE — Discharge Instructions (Signed)
Read the information below.  Use the prescribed medication as directed.  Please discuss all new medications with your pharmacist.  You may return to the Emergency Department at any time for worsening condition or any new symptoms that concern you.    Please call the dentist listed above within 48 hours to schedule a close follow up appointment.  If you develop fevers, swelling in your face, difficulty swallowing or breathing, return to the ER immediately for a recheck.  If you develop uncontrolled pain, weakness or numbness of the extremity, severe discoloration of the skin, or you are unable to walk or move your ankle/toes, return to the ER for a recheck.      Ankle Pain Ankle pain is a common symptom. The bones, cartilage, tendons, and muscles of the ankle joint perform a lot of work each day. The ankle joint holds your body weight and allows you to move around. Ankle pain can occur on either side or back of 1 or both ankles. Ankle pain may be sharp and burning or dull and aching. There may be tenderness, stiffness, redness, or warmth around the ankle. The pain occurs more often when a person walks or puts pressure on the ankle. CAUSES  There are many reasons ankle pain can develop. It is important to work with your caregiver to identify the cause since many conditions can impact the bones, cartilage, muscles, and tendons. Causes for ankle pain include:  Injury, including a break (fracture), sprain, or strain often due to a fall, sports, or a high-impact activity.  Swelling (inflammation) of a tendon (tendonitis).  Achilles tendon rupture.  Ankle instability after repeated sprains and strains.  Poor foot alignment.  Pressure on a nerve (tarsal tunnel syndrome).  Arthritis in the ankle or the lining of the ankle.  Crystal formation in the ankle (gout or pseudogout). DIAGNOSIS  A diagnosis is based on your medical history, your symptoms, results of your physical exam, and results of  diagnostic tests. Diagnostic tests may include X-ray exams or a computerized magnetic scan (magnetic resonance imaging, MRI). TREATMENT  Treatment will depend on the cause of your ankle pain and may include:  Keeping pressure off the ankle and limiting activities.  Using crutches or other walking support (a cane or brace).  Using rest, ice, compression, and elevation.  Participating in physical therapy or home exercises.  Wearing shoe inserts or special shoes.  Losing weight.  Taking medications to reduce pain or swelling or receiving an injection.  Undergoing surgery. HOME CARE INSTRUCTIONS   Only take over-the-counter or prescription medicines for pain, discomfort, or fever as directed by your caregiver.  Put ice on the injured area.  Put ice in a plastic bag.  Place a towel between your skin and the bag.  Leave the ice on for 15-20 minutes at a time, 03-04 times a day.  Keep your leg raised (elevated) when possible to lessen swelling.  Avoid activities that cause ankle pain.  Follow specific exercises as directed by your caregiver.  Record how often you have ankle pain, the location of the pain, and what it feels like. This information may be helpful to you and your caregiver.  Ask your caregiver about returning to work or sports and whether you should drive.  Follow up with your caregiver for further examination, therapy, or testing as directed. SEEK MEDICAL CARE IF:   Pain or swelling continues or worsens beyond 1 week.  You have an oral temperature above 102 F (38.9 C).  You are feeling unwell or have chills.  You are having an increasingly difficult time with walking.  You have loss of sensation or other new symptoms.  You have questions or concerns. MAKE SURE YOU:   Understand these instructions.  Will watch your condition.  Will get help right away if you are not doing well or get worse.   This information is not intended to replace advice  given to you by your health care provider. Make sure you discuss any questions you have with your health care provider.   Document Released: 03/18/2010 Document Revised: 12/21/2011 Document Reviewed: 04/30/2015 Elsevier Interactive Patient Education 2016 [employer].    Emergency Department Resource Guide 1) Find a Doctor and Pay Out of Pocket Although you won't have to find out who is covered by your insurance plan, it is a good idea to ask around and get recommendations. You will then need to call the office and see if the doctor you have chosen will accept you as a new patient and what types of options they offer for patients who are self-pay. Some doctors offer discounts or will set up payment plans for their patients who do not have insurance, but you will need to ask so you aren't surprised when you get to your appointment.  2) Contact Your Local Health Department Not all health departments have doctors that can see patients for sick visits, but many do, so it is worth a call to see if yours does. If you don't know where your local health department is, you can check in your phone book. The CDC also has a tool to help you locate your state's health department, and many state websites also have listings of all of their local health departments.  3) Find a Walk-in Clinic If your illness is not likely to be very severe or complicated, you may want to try a walk in clinic. These are popping up all over the country in pharmacies, drugstores, and shopping centers. They're usually staffed by nurse practitioners or physician assistants that have been trained to treat common illnesses and complaints. They're usually fairly quick and inexpensive. However, if you have serious medical issues or chronic medical problems, these are probably not your best option.  No Primary Care Doctor: - Call Health Connect at  [phone] - they can help you locate a primary care doctor that  accepts your insurance,  provides certain services, etc. - Physician Referral Service- [phone]  Chronic Pain Problems: Organization         Address  Phone   Notes  [facility]  [phone] Patients need to be referred by their primary care doctor.   Medication Assistance: Organization         Address  Phone   Notes  [facility] Medication [facility] [address] [place] [place], [place] [address] [phone] --Must be a resident of [facility] -- Must have NO insurance coverage whatsoever (no Medicaid/ Medicare, etc.) -- The pt. MUST have a primary care doctor that directs their care regularly and follows them in the community   MedAssist  [phone]   [employer]  [phone]    Agencies that provide inexpensive medical care: Organization         Address  Phone   Notes  [facility]  [phone]   [facility]    [phone]   [facility] [address] [place],  [place] [address] [phone]   [facility] [address] [place] [phone]   Planned Parenthood    [phone]   [facility]    [phone]   [facility]  [address], [place] Phone:  [phone], Fax:  [phone] Hours of Operation:  9 am - 6 pm, M-F.  Also accepts Medicaid/Medicare and self-pay.  [facility]  [address], [place] Phone: [phone], Fax: [phone]. Hours of Operation:  8:30 am - 5:30 pm, M-F.  Also accepts Medicaid and self-pay.  [facility] [place] [address] [place] Phone: [phone]   [facility] [address] Natasha Bence [place], [place] [phone], Ext. [phone] Mondays & Thursdays: 7-9 AM.  First 15 patients are seen on a first come, first serve basis.    Medicaid-accepting [facility] Providers:  Organization         Address  Phone    Notes  [facility] [address], [place] [phone] Also accepts self-pay patients.  [facility] [address] Laurell Josephs [place], [place]  [phone]   [facility] [address], [place] [phone]   [facility] [address] [place] [phone]   Renaye Rakers [address], [place]   [phone] Only accepts [place] patients after they have their name applied to their card.   Self-Pay (no insurance) in [facility]:  Organization         Address  Phone   Notes  Sickle Cell Patients, [facility] [address] [place], [place] [phone]   [facility] [address] [place], [place] [phone]   [facility] [place]  [address], [place] [phone]   [facility]/Dr. Osei-Bonsu  [address] [place] or [address], [place] [phone] Phone number for both [place] and [place] locations is the same.  [facility] [address] [place] [phone]   [facility] [address] [place] or [address] [phone]   [facility] [address] [place] [phone], phone; [phone], fax Sees patients 1st and 3rd Saturday of every month.  Must not qualify for public or private insurance (i.e. Medicaid, Medicare, [place] Health Choice, Veterans' Benefits)  Household income should be no more than 200% of the poverty level The clinic cannot treat you if you are pregnant or think you are pregnant  Sexually transmitted diseases are not treated at the clinic.    Dental Care: Organization         Address  Phone  Notes  [facility] Department of [facility] [address] [place], [place] [phone] Accepts children up to age 12 who are enrolled in [place] or [place] Health Choice; pregnant women with a Medicaid card; and children who have applied for Medicaid or [place] Health Choice, but were declined, whose parents can pay a reduced fee at time of service.  [facility] Department of [facility]  [address], [place] [phone] Accepts children up to age 26 who are enrolled in [place] or [place] Health Choice; pregnant  women with a Medicaid card; and children who have applied for Medicaid or [place] Health Choice, but were declined, whose parents can pay a reduced fee at time of service.  [facility]  [address] [place], [place] [phone] Patients are seen by appointment only. Walk-ins are not accepted. [facility] will see patients 62 years of age and older. Monday - Tuesday (8am-5pm) Most Wednesdays (8:30-5pm) $30 per visit, cash only  [facility] Adult [facility] Access PRO[facility]  [address], [facility] [phone] Patients are seen by appointment only. Walk-ins are not accepted. [facility] will see patients 40 years of age and older. One Wednesday Evening (Monthly: Volunteer Based).  $30 per visit, cash only  [employer]  [phone] for adults; Children under age 9, call Graduate Pediatric Dentistry at [phone]. Children aged 62-14, please call [phone] to request a pediatric application.  Dental services are provided in all areas of dental care including fillings, crowns and bridges, complete and partial dentures, implants, gum treatment, root canals, and extractions. Preventive care is also provided. Treatment is provided to both adults and children. Patients are selected via a lottery and there is often a waiting list.   [facility] [address] [place]  [phone] [email or web address]   [facility] [address] [place], [place] [phone], Ext.  123 Second and Fourth Thursday of each month, opens at 6:30 AM; Clinic ends at 9 AM.  Patients are seen on a first-come first-served basis, and a limited number are seen during each clinic.   [facility]  [address] Ether Griffins [place], [place] [phone]   Eligibility Requirements You must have lived in [place], [place], or [place] counties for at least the last three months.   You cannot be eligible for state or federal sponsored [place], including [employer], [place], or [employer].   You generally cannot be eligible for healthcare insurance through your employer.    How to apply: Eligibility screenings are held every Tuesday and Wednesday afternoon from 1:00 pm until 4:00 pm. You do not need an appointment for the interview!  [facility] [address] [place], [place] [phone]   [facility] Health Department  [phone]   [facility] Health Department  [phone]   [facility] Health Department  [phone]    Behavioral Health Resources in the Community: Intensive Outpatient Programs Organization         Address  Phone  Notes  [facility] [address] [place], [place] [phone]   [facility] [address], [place], [place] [phone]   [facility] [address] [place], [place]  [phone]   [facility] [address],  [place], [place] [phone] or [phone]   Substance Abuse Resources Organization         Address  Phone  Notes  [facility] Drug Services  [phone]   [facility]  [phone]   [facility] [place]  [phone]   Floydene Flock  [phone]   Residential & Outpatient Substance Abuse Program  [phone]   Psychological Services Organization         Address  Phone  Notes  [facility]  [phone]   [facility]  [phone]   [facility]  Mental Health [address], [place] [phone] or [phone]    Mobile Crisis  Teams Organization         Address  Phone  Notes  [facility], Mobile Crisis Care Unit  [phone]   [facility]  [address] [place], [place] [phone]   [facility] [address] [place] [phone]    Self-Help/Support Groups Organization         Address  Phone             Notes  [facility] of [place] - variety of support groups  336- [phone] Call for more information  Narcotics Anonymous (NA), [facility] [address], [place]  2 meetings at this location   Statisticianesidential Treatment Programs Organization         Address  Phone  Notes  [facility] [address],    [place]  [phone]   [facility]  [address], [place] [phone], [place], [place] [phone]   [facility] [address], [place] [phone] Admissions: 8am-3pm M-F  [facility] [address]    [place], [place] [phone]   [facility] [address], [place], [place] [phone]   [facility] [address]  [place], [place] [phone]   [facility] - Intensive Outpatient [address], [place], [place] [phone]   [facility] ([facility] [address]  [place], [place] [phone] or [phone]   [facility] ([facility]) [address] [place], [place] [phone] Accepts Medicaid  [facility] [address]  [place] [phone] Substance Abuse/Addiction Treatment   [facility] Behavioral Health Resources Organization         Address  Phone  Notes  [facility]  [phone]) [phone]   Angie FavaJulie Brannon, PhD [address] [place], [place]   [phone] or [phone](336) [phone]   [facility] [place]   [address] [place], [place] [phone](336) [phone]   [facility] [address], [place], [place] [phone] Insurance/Medicaid/sponsorship through [facility]  [facility] [address]                                    [place], [place] [phone] Therapy/tele-psych/case  [facility] [address]   [place], [place] [phone]) [phone]    Dr. Lolly MustacheArfeen  [phone]   [facility] [place]  [employer] [facility] 1) [address], [place] 2) [address], [place] 3)  [address], [place] [phone]) [phone]  [phone]) [phone]   [facility] Child Abuse Hotline [phone](336) [phone] or [phone]) [phone] (After Hours)

## 2015-09-28 NOTE — ED Notes (Signed)
Pt c/o pain to back LT tooth severely x 2 days and RT ankle pain 2 nights ago after missing 2 steps and twisting ankle.

## 2015-10-14 ENCOUNTER — Emergency Department (HOSPITAL_COMMUNITY)
Admission: EM | Admit: 2015-10-14 | Discharge: 2015-10-14 | Disposition: A | Discharge status: Home / Self Care | Payer: Medicaid Other | Attending: Emergency Medicine | Admitting: Emergency Medicine

## 2015-10-14 ENCOUNTER — Encounter (HOSPITAL_COMMUNITY): Payer: Self-pay

## 2015-10-14 DIAGNOSIS — F17210 Nicotine dependence, cigarettes, uncomplicated: Secondary | ICD-10-CM | POA: Insufficient documentation

## 2015-10-14 DIAGNOSIS — K529 Noninfective gastroenteritis and colitis, unspecified: Secondary | ICD-10-CM | POA: Insufficient documentation

## 2015-10-14 DIAGNOSIS — Z862 Personal history of diseases of the blood and blood-forming organs and certain disorders involving the immune mechanism: Secondary | ICD-10-CM | POA: Insufficient documentation

## 2015-10-14 LAB — COMPREHENSIVE METABOLIC PANEL
ALT: 15 U/L (ref 14–54)
AST: 17 U/L (ref 15–41)
Albumin: 4 g/dL (ref 3.5–5.0)
Alkaline Phosphatase: 51 U/L (ref 38–126)
Anion gap: 8 (ref 5–15)
BUN: 15 mg/dL (ref 6–20)
CHLORIDE: 106 mmol/L (ref 101–111)
CO2: 25 mmol/L (ref 22–32)
CREATININE: 0.9 mg/dL (ref 0.44–1.00)
Calcium: 9 mg/dL (ref 8.9–10.3)
GFR calc Af Amer: >60 mL/min (ref >60)
GFR calc non Af Amer: >60 mL/min (ref >60)
GLUCOSE: 82 mg/dL (ref 65–99)
Potassium: 4.1 mmol/L (ref 3.5–5.1)
SODIUM: 139 mmol/L (ref 135–145)
Total Bilirubin: 0.4 mg/dL (ref 0.3–1.2)
Total Protein: 6.2 g/dL — ABNORMAL LOW (ref 6.5–8.1)

## 2015-10-14 LAB — LIPASE, BLOOD: LIPASE: 21 U/L (ref 11–51)

## 2015-10-14 LAB — CBC
HCT: 41 % (ref 36.0–46.0)
Hemoglobin: 13.7 g/dL (ref 12.0–15.0)
MCH: 30.9 pg (ref 26.0–34.0)
MCHC: 33.4 g/dL (ref 30.0–36.0)
MCV: 92.6 fL (ref 78.0–100.0)
Platelets: 155 10*3/uL (ref 150–400)
RBC: 4.43 MIL/uL (ref 3.87–5.11)
RDW: 13.3 % (ref 11.5–15.5)
WBC: 6.5 10*3/uL (ref 4.0–10.5)

## 2015-10-14 LAB — URINALYSIS, ROUTINE W REFLEX MICROSCOPIC
BILIRUBIN URINE: NEGATIVE
Glucose, UA: NEGATIVE mg/dL
HGB URINE DIPSTICK: NEGATIVE
KETONES UR: NEGATIVE mg/dL
Leukocytes, UA: NEGATIVE
Nitrite: NEGATIVE
Protein, ur: NEGATIVE mg/dL
Specific Gravity, Urine: 1.02 (ref 1.005–1.030)
pH: 6 (ref 5.0–8.0)

## 2015-10-14 MED ORDER — SODIUM CHLORIDE 0.9 % IV BOLUS (SEPSIS)
1000.0000 mL | Freq: Once | INTRAVENOUS | Status: AC
  Administered 2015-10-14: 1000 mL via INTRAVENOUS

## 2015-10-14 MED ORDER — FENTANYL CITRATE (PF) 100 MCG/2ML IJ SOLN
50.0000 ug | Freq: Once | INTRAMUSCULAR | Status: AC
Start: 2015-10-14 — End: 2015-10-14
  Administered 2015-10-14: 50 ug via INTRAVENOUS
  Filled 2015-10-14: qty 2

## 2015-10-14 MED ORDER — ONDANSETRON HCL 4 MG/2ML IJ SOLN
4.0000 mg | Freq: Once | INTRAMUSCULAR | Status: AC
  Administered 2015-10-14: 4 mg via INTRAVENOUS
  Filled 2015-10-14: qty 2

## 2015-10-14 MED ORDER — ONDANSETRON HCL 8 MG PO TABS: 8.0000 mg | ORAL_TABLET | Freq: Three times a day (TID) | ORAL | Status: DC | PRN

## 2015-10-14 MED ORDER — SODIUM CHLORIDE 0.9 % IV BOLUS (SEPSIS)
1000.0000 mL | Freq: Once | INTRAVENOUS | Status: AC
Start: 2015-10-14 — End: 2015-10-14
  Administered 2015-10-14: 1000 mL via INTRAVENOUS

## 2015-10-14 NOTE — ED Notes (Addendum)
Patient here with abdominal cramping with vomiting and diarrhea x 3 days. Also complains of body aches and chills. Today denies emesis. Also wants abrasion  To forehead checked, she hit it on glass

## 2015-10-14 NOTE — Discharge Instructions (Signed)
Medications for nausea. Increase fluids.

## 2015-10-14 NOTE — ED Provider Notes (Addendum)
CSN: [id number]     Arrival date & time 10/14/15  1838 History   First MD Initiated Contact with Patient 10/14/15 2044     Chief Complaint  Patient presents with  . Abdominal Cramping  . Chills     (Consider location/radiation/quality/duration/timing/severity/associated sxs/prior Treatment) HPI.... Several episodes of nausea, vomiting, and diarrhea since Friday. She is fatigued with a headache and chills.  Vomiting has improved today. She has a history of anemia. Severity of symptoms is mild to moderate.  Past Medical History  Diagnosis Date  . No pertinent past medical history   . Anemia    Past Surgical History  Procedure Laterality Date  . Cesarean section  2008  . Wisdom tooth extraction    . Cesarean section  04/29/2012    Procedure: CESAREAN SECTION;  Surgeon: Lazaro Arms, MD;  Location: [facility];  Service: Gynecology;  Laterality: N/A;  Dr. Despina Hidden will be doing case   No family history on file. Social History  Substance Use Topics  . Smoking status: Current Every Day Smoker -- 1.00 packs/day for 13 years    Types: Cigarettes    Last Attempt to Quit: 11/13/2011  . Smokeless tobacco: Never Used  . Alcohol Use: Yes     Comment: socially   OB History    Gravida Para Term Preterm AB TAB SAB Ectopic Multiple Living   2 2 2       2      Review of Systems  All other systems reviewed and are negative.     Allergies  Codeine and Hydrocodone  Home Medications   Prior to Admission medications   Medication Sig Start Date End Date Taking? Authorizing Provider  acetaminophen (TYLENOL) 500 MG tablet Take 500 mg by mouth every 6 (six) hours as needed for mild pain.   Yes Historical Provider, MD  azithromycin (ZITHROMAX Z-PAK) 250 MG tablet Take 1 tablet (250 mg total) by mouth daily. 500mg  PO day 1, then 250mg  PO days 205 Patient not taking: Reported on 10/14/2015 08/27/15   Roxy Horseman, PA-C  ibuprofen (ADVIL,MOTRIN) 800 MG tablet Take 1 tablet (800 mg total) by mouth  every 8 (eight) hours as needed for mild pain or moderate pain. Patient not taking: Reported on 10/14/2015 09/28/15   Trixie Dredge, PA-C  ondansetron (ZOFRAN) 8 MG tablet Take 1 tablet (8 mg total) by mouth every 8 (eight) hours as needed for nausea or vomiting. 10/14/15   Donnetta Hutching, MD  predniSONE (DELTASONE) 20 MG tablet Take 2 tablets (40 mg total) by mouth daily. Patient not taking: Reported on 10/14/2015 08/27/15   Roxy Horseman, PA-C   BP 104/60 mmHg  Pulse 56  Temp(Src) 98.4 F (36.9 C) (Oral)  Resp 18  Ht 5\' 2"  (1.575 m)  Wt 124 lb (56.246 kg)  BMI 22.67 kg/m2  SpO2 99%  LMP 09/28/2015 Physical Exam  Constitutional: She is oriented to person, place, and time. She appears well-developed and well-nourished.  HENT:  Head: Normocephalic and atraumatic.  Eyes: Conjunctivae and EOM are normal. Pupils are equal, round, and reactive to light.  Neck: Normal range of motion. Neck supple.  Cardiovascular: Normal rate and regular rhythm.   Pulmonary/Chest: Effort normal and breath sounds normal.  Abdominal: Soft. Bowel sounds are normal.  Musculoskeletal: Normal range of motion.  Neurological: She is alert and oriented to person, place, and time.  Skin: Skin is warm and dry.  Psychiatric: She has a normal mood and affect. Her behavior is normal.  Nursing  note and vitals reviewed.   ED Course  Procedures (including critical care time) Labs Review Labs Reviewed  COMPREHENSIVE METABOLIC PANEL - Abnormal; Notable for the following:    Total Protein 6.2 (*)    All other components within normal limits  URINALYSIS, ROUTINE W REFLEX MICROSCOPIC (NOT AT [facility]) - Abnormal; Notable for the following:    APPearance CLOUDY (*)    All other components within normal limits  LIPASE, BLOOD  CBC  POC URINE PREG, ED    Imaging Review No results found. I have personally reviewed and evaluated these images and lab results as part of my medical decision-making.   EKG Interpretation None       MDM   Final diagnoses:  Gastroenteritis    No acute abdomen. No evidence of meningitis. Patient feels better after IV fluids, IV Zofran, IV fentanyl. Discharge medications Zofran 8 mg.    Donnetta HutchingBrian Nithin Demeo, MD 10/14/15 [id number]  Donnetta HutchingBrian Divonte Senger, MD 10/14/15 2225

## 2015-12-18 ENCOUNTER — Emergency Department (HOSPITAL_COMMUNITY)
Admission: EM | Admit: 2015-12-18 | Discharge: 2015-12-18 | Disposition: A | Discharge status: Home / Self Care | Payer: Self-pay | Attending: Emergency Medicine | Admitting: Emergency Medicine

## 2015-12-18 ENCOUNTER — Encounter (HOSPITAL_COMMUNITY): Payer: Self-pay | Admitting: Emergency Medicine

## 2015-12-18 ENCOUNTER — Emergency Department (HOSPITAL_COMMUNITY): Payer: Self-pay

## 2015-12-18 DIAGNOSIS — Z862 Personal history of diseases of the blood and blood-forming organs and certain disorders involving the immune mechanism: Secondary | ICD-10-CM | POA: Insufficient documentation

## 2015-12-18 DIAGNOSIS — R1110 Vomiting, unspecified: Secondary | ICD-10-CM | POA: Insufficient documentation

## 2015-12-18 DIAGNOSIS — R410 Disorientation, unspecified: Secondary | ICD-10-CM | POA: Insufficient documentation

## 2015-12-18 DIAGNOSIS — Z3202 Encounter for pregnancy test, result negative: Secondary | ICD-10-CM | POA: Insufficient documentation

## 2015-12-18 DIAGNOSIS — R569 Unspecified convulsions: Secondary | ICD-10-CM

## 2015-12-18 DIAGNOSIS — F17210 Nicotine dependence, cigarettes, uncomplicated: Secondary | ICD-10-CM | POA: Insufficient documentation

## 2015-12-18 DIAGNOSIS — R400 Somnolence: Secondary | ICD-10-CM | POA: Insufficient documentation

## 2015-12-18 DIAGNOSIS — R55 Syncope and collapse: Secondary | ICD-10-CM

## 2015-12-18 LAB — BASIC METABOLIC PANEL
Anion gap: 11 (ref 5–15)
BUN: 13 mg/dL (ref 6–20)
CHLORIDE: 107 mmol/L (ref 101–111)
CO2: 20 mmol/L — AB (ref 22–32)
CREATININE: 0.94 mg/dL (ref 0.44–1.00)
Calcium: 9.3 mg/dL (ref 8.9–10.3)
GFR calc Af Amer: >60 mL/min (ref >60)
GFR calc non Af Amer: >60 mL/min (ref >60)
GLUCOSE: 124 mg/dL — AB (ref 65–99)
POTASSIUM: 3.7 mmol/L (ref 3.5–5.1)
Sodium: 138 mmol/L (ref 135–145)

## 2015-12-18 LAB — CBG MONITORING, ED: Glucose-Capillary: 113 mg/dL — ABNORMAL HIGH (ref 65–99)

## 2015-12-18 LAB — CBC
HEMATOCRIT: 41 % (ref 36.0–46.0)
Hemoglobin: 14.3 g/dL (ref 12.0–15.0)
MCH: 31.7 pg (ref 26.0–34.0)
MCHC: 34.9 g/dL (ref 30.0–36.0)
MCV: 90.9 fL (ref 78.0–100.0)
Platelets: 148 10*3/uL — ABNORMAL LOW (ref 150–400)
RBC: 4.51 MIL/uL (ref 3.87–5.11)
RDW: 12.8 % (ref 11.5–15.5)
WBC: 6.7 10*3/uL (ref 4.0–10.5)

## 2015-12-18 LAB — POC URINE PREG, ED: Preg Test, Ur: NEGATIVE

## 2015-12-18 NOTE — Discharge Instructions (Signed)
No driving restrictions until assessed by neurology to determine whether these are epileptic seizures.  Syncope Syncope means a person passes out (faints). The person usually wakes up in less than 5 minutes. It is important to seek medical care for syncope. HOME CARE  Have someone stay with you until you feel normal.  Do not drive, use machines, or play sports until your doctor says it is okay.  Keep all doctor visits as told.  Lie down when you feel like you might pass out. Take deep breaths. Wait until you feel normal before standing up.  Drink enough fluids to keep your pee (urine) clear or pale yellow.  If you take blood pressure or heart medicine, get up slowly. Take several minutes to sit and then stand. GET HELP RIGHT AWAY IF:   You have a severe headache.  You have pain in the chest, belly (abdomen), or back.  You are bleeding from the mouth or butt (rectum).  You have black or tarry poop (stool).  You have an irregular or very fast heartbeat.  You have pain with breathing.  You keep passing out, or you have shaking (seizures) when you pass out.  You pass out when sitting or lying down.  You feel confused.  You have trouble walking.  You have severe weakness.  You have vision problems. If you fainted, call for help (911 in [place] Do not drive yourself to the hospital.   This information is not intended to replace advice given to you by your health care provider. Make sure you discuss any questions you have with your health care provider.   Document Released: 03/16/2008 Document Revised: 02/12/2015 Document Reviewed: 11/27/2011 Elsevier Interactive Patient Education [employer]  Seizure, Adult A seizure means there is unusual activity in the brain. A seizure can cause changes in attention or behavior. Seizures often cause shaking (convulsions). Seizures often last from 30 seconds to 2 minutes. HOME CARE   If you are given medicines, take them  exactly as told by your doctor.  Keep all doctor visits as told.  Do not swim or drive until your doctor says it is okay.  Teach others what to do if you have a seizure. They should:  Lay you on the ground.  Put a cushion under your head.  Loosen any tight clothing around your neck.  Turn you on your side.  Stay with you until you get better. GET HELP RIGHT AWAY IF:   The seizure lasts longer than 2 to 5 minutes.  The seizure is very bad.  The person does not wake up after the seizure.  The person's attention or behavior changes. Drive the person to the emergency room or call your local emergency services (911 in [place] MAKE SURE YOU:   Understand these instructions.  Will watch your condition.  Will get help right away if you are not doing well or get worse.   This information is not intended to replace advice given to you by your health care provider. Make sure you discuss any questions you have with your health care provider.   Document Released: 03/16/2008 Document Revised: 12/21/2011 Document Reviewed: 05/10/2013 Elsevier Interactive Patient Education [employer] Elsevier Inc.

## 2015-12-18 NOTE — ED Provider Notes (Signed)
CSN: [id number]     Arrival date & time 12/18/15  1930 History   First MD Initiated Contact with Patient 12/18/15 1937     Chief Complaint  Patient presents with  . Seizures     (Consider location/radiation/quality/duration/timing/severity/associated sxs/prior Treatment) Patient is a 30 y.o. female presenting with seizures. The history is provided by a friend and the patient.  Seizures Seizure activity on arrival: no   Seizure type:  Tonic Initial focality:  None Episode characteristics: stiffening and unresponsiveness   Postictal symptoms: confusion and somnolence   Return to baseline: yes   Severity:  Moderate Duration:  2 minutes Timing:  Once Progression:  Resolved Context: stress   Context: not alcohol withdrawal, not cerebral palsy, not change in medication, not sleeping less, not drug use, not emotional upset, not fever, not possible medication ingestion, not pregnant and not previous head injury   Recent head injury:  No recent head injuries PTA treatment:  None History of seizures: no    Additionally the patient reports that she has not eaten since yesterday afternoon and has been feeling ill recently. She reports one episode of emesis this morning. No reported fevers, cough, URI symptoms, urinary symptoms, diarrhea. No chest pain or shortness of breath.  Past Medical History  Diagnosis Date  . No pertinent past medical history   . Anemia    Past Surgical History  Procedure Laterality Date  . Cesarean section  2008  . Wisdom tooth extraction    . Cesarean section  04/29/2012    Procedure: CESAREAN SECTION;  Surgeon: Lazaro Arms, MD;  Location: [facility];  Service: Gynecology;  Laterality: N/A;  Dr. Despina Hidden will be doing case   History reviewed. No pertinent family history. Social History  Substance Use Topics  . Smoking status: Current Every Day Smoker -- 1.00 packs/day for 13 years    Types: Cigarettes    Last Attempt to Quit: 11/13/2011  . Smokeless tobacco: Never  Used  . Alcohol Use: Yes     Comment: socially   OB History    Gravida Para Term Preterm AB TAB SAB Ectopic Multiple Living   Review of Systems  Constitutional: Negative for fever, chills, appetite change and fatigue.  HENT: Negative for congestion, ear pain, facial swelling, mouth sores and sore throat.   Eyes: Negative for visual disturbance.  Respiratory: Negative for cough, chest tightness and shortness of breath.   Cardiovascular: Negative for chest pain and palpitations.  Gastrointestinal: Positive for vomiting (this morning). Negative for nausea, abdominal pain, diarrhea and blood in stool.  Endocrine: Negative for cold intolerance and heat intolerance.  Genitourinary: Negative for frequency, decreased urine volume and difficulty urinating.  Musculoskeletal: Negative for back pain and neck stiffness.  Skin: Negative for rash.  Neurological: Positive for seizures and syncope. Negative for dizziness, weakness, light-headedness and headaches.  All other systems reviewed and are negative.     Allergies  Codeine and Hydrocodone  Home Medications   Prior to Admission medications   Medication Sig Start Date End Date Taking? Authorizing Provider  azithromycin (ZITHROMAX Z-PAK) 250 MG tablet Take 1 tablet (250 mg total) by mouth daily.  PO day 1, then  PO days 205 Patient not taking: Reported on 10/14/2015 08/27/15   Roxy Horseman, PA-C  ibuprofen (ADVIL,MOTRIN) 800 MG tablet Take 1 tablet (800 mg total) by mouth every 8 (eight) hours as needed for mild pain or moderate  pain. Patient not taking: Reported on 10/14/2015 09/28/15   Trixie DredgeEmily West, PA-C  ondansetron (ZOFRAN) 8 MG tablet Take 1 tablet (8 mg total) by mouth every 8 (eight) hours as needed for nausea or vomiting. Patient not taking: Reported on 12/18/2015 10/14/15   Donnetta HutchingBrian Cook, MD  predniSONE (DELTASONE) 20 MG tablet Take 2 tablets (40 mg total) by mouth daily. Patient not taking: Reported on 10/14/2015  08/27/15   Roxy Horsemanobert Browning, PA-C   BP 102/64 mmHg  Pulse 62  Temp(Src) 99.1 F (37.3 C) (Oral)  Resp 15  Ht 5\' 2"  (1.575 m)  Wt 58.968 kg  BMI 23.77 kg/m2  SpO2 97%  LMP  (LMP Unknown) Physical Exam  Constitutional: She is oriented to person, place, and time. She appears well-developed and well-nourished. No distress.  HENT:  Head: Normocephalic and atraumatic.  Right Ear: External ear normal.  Left Ear: External ear normal.  Nose: Nose normal.  Eyes: Conjunctivae and EOM are normal. Pupils are equal, round, and reactive to light. Right eye exhibits no discharge. Left eye exhibits no discharge. No scleral icterus.  Neck: Normal range of motion. Neck supple.  Cardiovascular: Normal rate, regular rhythm and normal heart sounds.  Exam reveals no gallop and no friction rub.   No murmur heard. Pulmonary/Chest: Effort normal and breath sounds normal. No stridor. No respiratory distress. She has no wheezes.  Abdominal: Soft. She exhibits no distension. There is no tenderness.  Musculoskeletal: She exhibits no edema or tenderness.  Neurological: She is alert and oriented to person, place, and time.  Cranial Nerves  II Visual Fields: Intact to confrontation. Visual fields intact. III, IV, VI: Pupils equal and reactive to light and near. Full eye movement without nystagmus  V Facial Sensation: Normal. No weakness of masticatory muscles  VII: No facial weakness or asymmetry  VIII Auditory Acuity: Grossly normal  IX/X: The uvula is midline; the palate elevates symmetrically  XI: Normal sternocleidomastoid and trapezius strength  XII: The tongue is midline. No atrophy or fasciculations.   Motor System: Muscle Strength: 5/5 and symmetric in the upper and lower extremities. No pronation or drift.  Muscle Tone: Tone and muscle bulk are normal in the upper and lower extremities.   Reflexes: DTRs: 2+ and symmetrical in all four extremities. Plantar responses are flexor bilaterally.   Coordination: Intact finger-to-nose, heel-to-shin, and rapid alternating movements. No tremor.  Sensation: Intact to light touch.     Skin: Skin is warm and dry. No rash noted. She is not diaphoretic. No erythema.  Psychiatric: She has a normal mood and affect.    ED Course  Procedures (including critical care time) Labs Review Labs Reviewed  BASIC METABOLIC PANEL - Abnormal; Notable for the following:    CO2 20 (*)    Glucose, Bld 124 (*)    All other components within normal limits  CBC - Abnormal; Notable for the following:    Platelets 148 (*)    All other components within normal limits  CBG MONITORING, ED - Abnormal; Notable for the following:    Glucose-Capillary 113 (*)    All other components within normal limits  I-STAT BETA HCG BLOOD, ED ([facility], [facility], [facility] ONLY)  POC URINE PREG, ED    Imaging Review Ct Head Wo Contrast  12/18/2015  CLINICAL DATA:  Seizure EXAM: CT HEAD WITHOUT CONTRAST TECHNIQUE: Contiguous axial images were obtained from the base of the skull through the vertex without intravenous contrast. COMPARISON:  CT head 02/27/2004 FINDINGS: Image quality degraded by  motion Ventricle size is normal. Negative for acute or chronic infarction. Negative for hemorrhage or fluid collection. Negative for mass or edema. No shift of the midline structures. Calvarium is intact. IMPRESSION: Motion degraded study.  No significant abnormality Electronically Signed   By: Marlan Palau M.D.   On: 12/18/2015 21:09   I have personally reviewed and evaluated these images and lab results as part of my medical decision-making.   EKG Interpretation   Date/Time:  Wednesday December 18 2015 19:36:11 EST Ventricular Rate:  65 PR Interval:  128 QRS Duration: 94 QT Interval:  433 QTC Calculation: 450 R Axis:   70 Text Interpretation:  Sinus rhythm Normal ECG No old tracing to compare  Confirmed by MILLER  MD, BRIAN ([id number]) on 12/18/2015 9:42:12 PM      MDM   First time Seizure versus  syncope. CBG within normal limits. EKG without Brugada, prolonged QT, dysrhythmias, or blocks. CT head unremarkable. Labs without electrolyte derangements. Urine pregnancy negative.  No recurrence of syncope or seizure activity.  Patient safe for discharge with strict return precautions. Patient was given strict driving restrictions as well. She is to follow up with neurology for continued workup of possible first-time seizure.   Diagnosis studies interpreted by me and use to my clinical decision-making.   Patient seen in conjunction with Dr. Hyacinth Meeker  Final diagnoses:  Syncope and collapse  Observed seizure-like activity [facility])        Drema Pry, MD 12/18/15 [id number]  Eber Hong, MD 12/20/15 2144

## 2015-12-18 NOTE — ED Notes (Signed)
Per EMS, the patient was visiting friends when she "fell into a child" who lowered her to the floor, with seizure activity for 10 minutes, full body with foaming at the mouth. No hx of seizures, denies drugs or etoh use. No postictal period. Vs with ems: bp 126/76, p 78, o2 98 on room air, cbg 141, rr 16.

## 2015-12-18 NOTE — ED Notes (Signed)
cbg is 113. Phlebotomy at the bedside. Explained NPO status until cleared by MD. Patient acknowledges.

## 2015-12-18 NOTE — ED Provider Notes (Signed)
The patient is a 30 year old female, she presents after having a potential syncopal episode versus a seizure which occurred at home, on look her report that while she was standing up she passed out, when she was on the ground she had some increased tone and stiffness, there is no obvious tonic-clonic activity, no urinary incontinence and no biting her tongue. Apparently she came around very quickly and asked what was going on when she woke up. Those before the paramedics arrived. On exam the patient has no complaints, heart and lungs normal, abdomen normal and soft, no edema, normal neurologic exam including her mental status, ability to follow commands with normal strength and sensation and cranial nerves III through XII. We'll recommend neurological follow-up for EEG. CT scan of labs and an unremarkable.  I saw and evaluated the patient, reviewed the resident's note and I agree with the findings and plan.   EKG Interpretation  Date/Time:  Wednesday December 18 2015 19:36:11 EST Ventricular Rate:  65 PR Interval:  128 QRS Duration: 94 QT Interval:  433 QTC Calculation: 450 R Axis:   70 Text Interpretation:  Sinus rhythm Normal ECG No old tracing to compare Confirmed by Alona Danford  MD, Joquan Lotz ([id number]) on 12/18/2015 9:42:12 PM       Final diagnoses:  Syncope and collapse  Observed seizure-like activity ([facility])      Eber HongBrian Chinara Hertzberg, MD 12/20/15 2145

## 2016-10-12 ENCOUNTER — Emergency Department (HOSPITAL_COMMUNITY)
Admission: EM | Admit: 2016-10-12 | Discharge: 2016-10-12 | Disposition: A | Discharge status: Home / Self Care | Payer: Medicaid Other | Attending: Emergency Medicine | Admitting: Emergency Medicine

## 2016-10-12 ENCOUNTER — Emergency Department (HOSPITAL_COMMUNITY): Payer: Medicaid Other

## 2016-10-12 ENCOUNTER — Encounter (HOSPITAL_COMMUNITY): Payer: Self-pay | Admitting: *Deleted

## 2016-10-12 DIAGNOSIS — R059 Cough, unspecified: Secondary | ICD-10-CM

## 2016-10-12 DIAGNOSIS — F17210 Nicotine dependence, cigarettes, uncomplicated: Secondary | ICD-10-CM | POA: Insufficient documentation

## 2016-10-12 DIAGNOSIS — R05 Cough: Secondary | ICD-10-CM

## 2016-10-12 DIAGNOSIS — J069 Acute upper respiratory infection, unspecified: Secondary | ICD-10-CM

## 2016-10-12 DIAGNOSIS — Z720 Tobacco use: Secondary | ICD-10-CM

## 2016-10-12 MED ORDER — ALBUTEROL SULFATE HFA 108 (90 BASE) MCG/ACT IN AERS: 2.0000 | INHALATION_SPRAY | RESPIRATORY_TRACT | 0 refills | Status: DC | PRN

## 2016-10-12 MED ORDER — ALBUTEROL SULFATE HFA 108 (90 BASE) MCG/ACT IN AERS
2.0000 | INHALATION_SPRAY | Freq: Once | RESPIRATORY_TRACT | Status: AC
  Administered 2016-10-12: 2 via RESPIRATORY_TRACT
  Filled 2016-10-12: qty 6.7

## 2016-10-12 NOTE — ED Triage Notes (Signed)
To ED for eval of cough over the past 3 months. Cough is productive. Denies fevers. Pt smokes 1 pack a day since she was 31 yrs old.

## 2016-10-12 NOTE — ED Notes (Signed)
Changed into gown 

## 2016-10-12 NOTE — ED Notes (Signed)
Patient transported to X-ray via wheelchair 

## 2016-10-12 NOTE — Discharge Instructions (Signed)
Continue to stay well-hydrated.  Continue to alternate between Tylenol and Ibuprofen for pain or fever. Use Mucinex for cough suppression/expectoration of mucus. Use netipot and flonase to help with nasal congestion. May consider over-the-counter Benadryl or other antihistamine to decrease secretions and for help with your symptoms. Use inhaler as directed, as needed for cough/chest congestion/wheezing/shortness of breath. STOP SMOKING! Follow up with [facility] in 5-7 days for recheck of ongoing symptoms and to establish medical care. Return to emergency department for emergent changing or worsening of symptoms.

## 2016-10-12 NOTE — ED Provider Notes (Signed)
[facility]-EMERGENCY DEPT Provider Note   CSN: [id number] Arrival date & time: 10/12/16  1133    By signing my name below, I, Valentino SaxonBianca Contreras, attest that this documentation has been prepared under the direction and in the presence of Koral Thaden Camprubi-Soms, PA-C. Electronically Signed: Valentino SaxonBianca Contreras, ED Scribe. 10/12/16. 12:12 PM.  History   Chief Complaint Chief Complaint  Patient presents with  . Cough   The history is provided by the patient and medical records. No language interpreter was used.  Cough  This is a recurrent problem. The current episode started more than 1 week ago. The problem occurs constantly. The problem has not changed since onset.The cough is productive of sputum. There has been no fever. Associated symptoms include rhinorrhea, shortness of breath and wheezing. Pertinent negatives include no chest pain, no chills, no ear pain, no sore throat and no myalgias. She has tried nothing for the symptoms. The treatment provided no relief. She is a smoker.   HPI Comments: [name] is a 31 y.o. female with a PMHx of anemia, who presents to the Emergency Department complaining of moderate persistent cough with yellowish brownish sputum production onset three months ago. States she came here "to get checked for COPD". Pt states she is a current everyday smoker (1.5 ppd since age 89), and switched from [place] to [place] which had helped, but now she's smoking [place] again and that worsened her cough. She endorses associated wheezing and SOB intermittently. She also reports 2 wks of associated yellowish rhinorrhea and thinks she has a cold. Denies taking anything for her symptoms, No known aggravating or alleviating factors. She also denies recent sick contact. Denies fevers, chills, CP, LE swelling, recent travel/surgery/immobilization, estrogen use, personal/family hx of DVT/PE, ear pain/drainage, sore throat, abd pain, n/v/d/c, dysuria, hematuria, myalgias, arthralgias,  rashes, numbness, tingling, weakness, or any other complaints at this time. Currently doesn't have a PCP due to lack of insurance. Reports that in the past when she was seen for cough, she was given Zpak and prednisone and inhaler because "her lungs were inflamed".     Past Medical History:  Diagnosis Date  . Anemia   . No pertinent past medical history     There are no active problems to display for this patient.   Past Surgical History:  Procedure Laterality Date  . CESAREAN SECTION  2008  . CESAREAN SECTION  04/29/2012   Procedure: CESAREAN SECTION;  Surgeon: Lazaro ArmsLuther H Eure, MD;  Location: [facility];  Service: Gynecology;  Laterality: N/A;  Dr. Despina HiddenEure will be doing case  . WISDOM TOOTH EXTRACTION      OB History    Gravida Para Term Preterm AB Living   2 2 2     2    SAB TAB Ectopic Multiple Live Births           2       Home Medications    Prior to Admission medications   Medication Sig Start Date End Date Taking? Authorizing Provider  azithromycin (ZITHROMAX Z-PAK) 250 MG tablet Take 1 tablet (250 mg total) by mouth daily. 500mg  PO day 1, then 250mg  PO days 205 Patient not taking: Reported on 10/14/2015 08/27/15   Roxy Horsemanobert Browning, PA-C  ibuprofen (ADVIL,MOTRIN) 800 MG tablet Take 1 tablet (800 mg total) by mouth every 8 (eight) hours as needed for mild pain or moderate pain. Patient not taking: Reported on 10/14/2015 09/28/15   Trixie DredgeEmily West, PA-C  ondansetron (ZOFRAN) 8 MG tablet Take 1 tablet (8  mg total) by mouth every 8 (eight) hours as needed for nausea or vomiting. Patient not taking: Reported on 12/18/2015 10/14/15   Donnetta Hutching, MD  predniSONE (DELTASONE) 20 MG tablet Take 2 tablets (40 mg total) by mouth daily. Patient not taking: Reported on 10/14/2015 08/27/15   Roxy Horseman, PA-C    Family History No family history on file.  Social History Social History  Substance Use Topics  . Smoking status: Current Every Day Smoker    Packs/day: 1.00    Years: 13.00    Types:  Cigarettes    Last attempt to quit: 11/13/2011  . Smokeless tobacco: Never Used  . Alcohol use Yes     Comment: socially     Allergies   Codeine and Hydrocodone   Review of Systems Review of Systems  Constitutional: Negative for chills and fever.  HENT: Positive for rhinorrhea. Negative for ear discharge, ear pain and sore throat.   Respiratory: Positive for cough, shortness of breath and wheezing.   Cardiovascular: Negative for chest pain and leg swelling.  Gastrointestinal: Negative for abdominal pain, constipation, diarrhea, nausea and vomiting.  Genitourinary: Negative for dysuria and hematuria.  Musculoskeletal: Negative for arthralgias and myalgias.  Skin: Negative for color change.  Allergic/Immunologic: Negative for immunocompromised state.  Neurological: Negative for weakness and numbness.  Psychiatric/Behavioral: Negative for confusion.  10 Systems reviewed and all are negative for acute change except as noted in the HPI.    Physical Exam Updated Vital Signs BP 122/84   Pulse 76   Temp 97.8 F (36.6 C) (Oral)   Resp 16   Ht 5\' 2"  (1.575 m)   Wt 134 lb (60.8 kg)   SpO2 99%   BMI 24.51 kg/m   Physical Exam  Constitutional: She is oriented to person, place, and time. Vital signs are normal. She appears well-developed and well-nourished.  Non-toxic appearance. No distress.  Afebrile, nontoxic, NAD  HENT:  Head: Normocephalic and atraumatic.  Nose: Mucosal edema and rhinorrhea present.  Mouth/Throat: Uvula is midline, oropharynx is clear and moist and mucous membranes are normal. No trismus in the jaw. No uvula swelling. Tonsils are 0 on the right. Tonsils are 0 on the left. No tonsillar exudate.  Nose with mild mucosal edema and rhinorrhea. Oropharynx clear and moist, without uvular swelling or deviation, no trismus or drooling, no tonsillar swelling or erythema, no exudates.   Eyes: Conjunctivae and EOM are normal. Right eye exhibits no discharge. Left eye  exhibits no discharge.  Neck: Normal range of motion. Neck supple.  Cardiovascular: Normal rate, regular rhythm, normal heart sounds and intact distal pulses.  Exam reveals no gallop and no friction rub.   No murmur heard. Pulmonary/Chest: Effort normal and breath sounds normal. No respiratory distress. She has no decreased breath sounds. She has no wheezes. She has no rhonchi. She has no rales.  CTAB in all lung fields, no w/r/r, no hypoxia or increased WOB, speaking in full sentences, SpO2 99% on RA, intermittent dry cough during exam.  Abdominal: Soft. Normal appearance and bowel sounds are normal. She exhibits no distension. There is no tenderness. There is no rigidity, no rebound, no guarding, no CVA tenderness, no tenderness at McBurney's point and negative Murphy's sign.  Musculoskeletal: Normal range of motion.  Neurological: She is alert and oriented to person, place, and time. She has normal strength. No sensory deficit.  Skin: Skin is warm, dry and intact. No rash noted.  Psychiatric: She has a normal mood and affect.  Nursing note and vitals reviewed.    ED Treatments / Results   DIAGNOSTIC STUDIES: Oxygen Saturation is 99% on RA, normal by my interpretation.    COORDINATION OF CARE: 12:02 PM Discussed treatment plan with pt at bedside which includes chest imaging and pt agreed to plan.   Labs (all labs ordered are listed, but only abnormal results are displayed) Labs Reviewed - No data to display  EKG  EKG Interpretation None       Radiology Dg Chest 2 View  Result Date: 10/12/2016 CLINICAL DATA:  Patient with productive cough and chest pain for 3 months. EXAM: CHEST  2 VIEW COMPARISON:  Chest radiograph 08/27/2015. FINDINGS: The heart size and mediastinal contours are within normal limits. Both lungs are clear. The visualized skeletal structures are unremarkable. IMPRESSION: No active cardiopulmonary disease. Electronically Signed   By: Annia Belt M.D.   On:  10/12/2016 12:39    Procedures Procedures (including critical care time)  Medications Ordered in ED Medications  albuterol (PROVENTIL HFA;VENTOLIN HFA) 108 (90 Base) MCG/ACT inhaler 2 puff (not administered)     Initial Impression / Assessment and Plan / ED Course  I have reviewed the triage vital signs and the nursing notes.  Pertinent labs & imaging results that were available during my care of the patient were reviewed by me and considered in my medical decision making (see chart for details).  Clinical Course     31 y.o. female here with cough/SOB/wheezing x3 months, and rhinorrhea x2 wks. +Smoker. Wanted to be "checked for COPD". Pt is afebrile with a clear lung exam. Mild rhinorrhea. No wheezing on exam. PERC neg, doubt PE. Will obtain CXR to eval for PNA, but cough likely either viral or due to tobacco use. Discussed that COPD is a diagnosis based on pulmonary function studies and other evaluation that isn't done in the ED and she would need to f/up with a primary care doctor for that. Will await CXR and then reassess. Doubt need for other emergent work up at this time, and doubt need for duoneb currently.  1:03 PM CXR negative for acute findings. Likely viral URI or possibly tobacco related cough, vs silent reflux-related cough. Doubt need for abx. Will send home with albuterol to help for when she has wheezing/SOB. Discussed OTC meds for symptom control, and smoking cessation was strongly encouraged. Pt is agreeable to symptomatic treatment with close follow up with [facility] in 1-2wks to establish care and recheck of symptoms. Spoke at length about emergent changing or worsening of symptoms that should prompt return to ER. Pt voices understanding and is agreeable to plan. Stable at time of discharge.    I personally performed the services described in this documentation, which was scribed in my presence. The recorded information has been reviewed and is accurate.   Final Clinical  Impressions(s) / ED Diagnoses   Final diagnoses:  Cough  Tobacco user  Upper respiratory tract infection, unspecified type    New Prescriptions New Prescriptions   ALBUTEROL (PROVENTIL HFA;VENTOLIN HFA) 108 (90 BASE) MCG/ACT INHALER    Inhale 2 puffs into the lungs every 4 (four) hours as needed for wheezing or shortness of breath (cough).     Maud Rubendall Camprubi-Soms, PA-C 10/12/16 1303    Donnetta Hutching, MD 10/14/16 [phone]

## 2017-04-19 ENCOUNTER — Emergency Department (HOSPITAL_COMMUNITY)
Admission: EM | Admit: 2017-04-19 | Discharge: 2017-04-19 | Disposition: A | Discharge status: Home / Self Care | Payer: Self-pay | Attending: Emergency Medicine | Admitting: Emergency Medicine

## 2017-04-19 ENCOUNTER — Emergency Department (HOSPITAL_COMMUNITY): Payer: Self-pay

## 2017-04-19 ENCOUNTER — Encounter (HOSPITAL_COMMUNITY): Payer: Self-pay | Admitting: Emergency Medicine

## 2017-04-19 DIAGNOSIS — Y9311 Activity, swimming: Secondary | ICD-10-CM | POA: Insufficient documentation

## 2017-04-19 DIAGNOSIS — Y929 Unspecified place or not applicable: Secondary | ICD-10-CM | POA: Insufficient documentation

## 2017-04-19 DIAGNOSIS — Y999 Unspecified external cause status: Secondary | ICD-10-CM | POA: Insufficient documentation

## 2017-04-19 DIAGNOSIS — W01198A Fall on same level from slipping, tripping and stumbling with subsequent striking against other object, initial encounter: Secondary | ICD-10-CM | POA: Insufficient documentation

## 2017-04-19 DIAGNOSIS — W19XXXA Unspecified fall, initial encounter: Secondary | ICD-10-CM

## 2017-04-19 DIAGNOSIS — F17210 Nicotine dependence, cigarettes, uncomplicated: Secondary | ICD-10-CM | POA: Insufficient documentation

## 2017-04-19 DIAGNOSIS — S39012A Strain of muscle, fascia and tendon of lower back, initial encounter: Secondary | ICD-10-CM

## 2017-04-19 LAB — URINALYSIS, ROUTINE W REFLEX MICROSCOPIC
BILIRUBIN URINE: NEGATIVE
Bacteria, UA: NONE SEEN
GLUCOSE, UA: NEGATIVE mg/dL
KETONES UR: NEGATIVE mg/dL
NITRITE: NEGATIVE
PROTEIN: NEGATIVE mg/dL
Specific Gravity, Urine: 1.014 (ref 1.005–1.030)
pH: 5 (ref 5.0–8.0)

## 2017-04-19 LAB — POC URINE PREG, ED: PREG TEST UR: NEGATIVE

## 2017-04-19 MED ORDER — CEPHALEXIN 500 MG PO CAPS: 500.0000 mg | ORAL_CAPSULE | Freq: Four times a day (QID) | ORAL | 0 refills | Status: DC

## 2017-04-19 MED ORDER — HYDROCODONE-ACETAMINOPHEN 5-325 MG PO TABS
1.0000 | ORAL_TABLET | Freq: Once | ORAL | Status: AC
  Administered 2017-04-19: 1 via ORAL
  Filled 2017-04-19: qty 1

## 2017-04-19 MED ORDER — CYCLOBENZAPRINE HCL 10 MG PO TABS: 10.0000 mg | ORAL_TABLET | Freq: Three times a day (TID) | ORAL | 0 refills | Status: DC | PRN

## 2017-04-19 MED ORDER — DICLOFENAC SODIUM 75 MG PO TBEC: 75.0000 mg | DELAYED_RELEASE_TABLET | Freq: Two times a day (BID) | ORAL | 0 refills | Status: DC

## 2017-04-19 MED ORDER — IBUPROFEN 800 MG PO TABS
800.0000 mg | ORAL_TABLET | Freq: Once | ORAL | Status: AC
  Administered 2017-04-19: 800 mg via ORAL
  Filled 2017-04-19: qty 1

## 2017-04-19 MED ORDER — CYCLOBENZAPRINE HCL 10 MG PO TABS
10.0000 mg | ORAL_TABLET | Freq: Once | ORAL | Status: AC
  Administered 2017-04-19: 10 mg via ORAL
  Filled 2017-04-19: qty 1

## 2017-04-19 NOTE — Discharge Instructions (Signed)
Apply ice packs on/off to your back.  Follow-up with the health dept or return here if not improving in one week

## 2017-04-19 NOTE — ED Triage Notes (Signed)
Pt reports falling down some rocks 2 days ago while swimming.  Multiple bruises, but pt's main complaint is sharp pain in back when moving a certain way.  Denies neuro s/s.

## 2017-04-19 NOTE — ED Notes (Signed)
Pt taken to xray 

## 2017-04-21 LAB — URINE CULTURE
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
## 2017-09-22 ENCOUNTER — Encounter (HOSPITAL_COMMUNITY): Payer: Self-pay | Admitting: *Deleted

## 2017-09-22 ENCOUNTER — Emergency Department (HOSPITAL_COMMUNITY)
Admission: EM | Admit: 2017-09-22 | Discharge: 2017-09-22 | Disposition: A | Discharge status: Home / Self Care | Payer: Self-pay | Attending: Emergency Medicine | Admitting: Emergency Medicine

## 2017-09-22 ENCOUNTER — Emergency Department (HOSPITAL_COMMUNITY): Payer: Self-pay

## 2017-09-22 DIAGNOSIS — F17210 Nicotine dependence, cigarettes, uncomplicated: Secondary | ICD-10-CM | POA: Insufficient documentation

## 2017-09-22 DIAGNOSIS — J069 Acute upper respiratory infection, unspecified: Secondary | ICD-10-CM | POA: Insufficient documentation

## 2017-09-22 DIAGNOSIS — Z79899 Other long term (current) drug therapy: Secondary | ICD-10-CM | POA: Insufficient documentation

## 2017-09-22 HISTORY — DX: Unspecified convulsions: R56.9

## 2017-09-22 LAB — URINALYSIS, ROUTINE W REFLEX MICROSCOPIC
BILIRUBIN URINE: NEGATIVE
GLUCOSE, UA: NEGATIVE mg/dL
HGB URINE DIPSTICK: NEGATIVE
KETONES UR: NEGATIVE mg/dL
LEUKOCYTES UA: NEGATIVE
NITRITE: POSITIVE — AB
PH: 5 (ref 5.0–8.0)
Protein, ur: NEGATIVE mg/dL
SPECIFIC GRAVITY, URINE: 1.017 (ref 1.005–1.030)

## 2017-09-22 LAB — PREGNANCY, URINE: Preg Test, Ur: NEGATIVE

## 2017-09-22 LAB — RAPID STREP SCREEN (MED CTR MEBANE ONLY): STREPTOCOCCUS, GROUP A SCREEN (DIRECT): NEGATIVE

## 2017-09-22 NOTE — ED Provider Notes (Signed)
[facility] EMERGENCY DEPARTMENT Provider Note   CSN: [id number] Arrival date & time: 09/22/17  4098     History   Chief Complaint Chief Complaint  Patient presents with  . Fever    HPI [name] is a 31 y.o. female.  HPI  Pt was seen at 1245.   Per pt, c/o gradual onset and persistence of constant sore throat, runny/stuffy nose, sinus congestion, and cough for the past 7 days.  Denies fevers, no rash, no CP/SOB, no N/V/D, no abd pain.     Past Medical History:  Diagnosis Date  . Anemia   . No pertinent past medical history   . Seizures (HCC)     There are no active problems to display for this patient.   Past Surgical History:  Procedure Laterality Date  . CESAREAN SECTION  2008  . CESAREAN SECTION  04/29/2012   Procedure: CESAREAN SECTION;  Surgeon: Lazaro Arms, MD;  Location: [facility]S;  Service: Gynecology;  Laterality: N/A;  Dr. Despina Hidden will be doing case  . WISDOM TOOTH EXTRACTION      OB History    Gravida Para Term Preterm AB Living   3 2 2     2    SAB TAB Ectopic Multiple Live Births           2       Home Medications    Prior to Admission medications   Medication Sig Start Date End Date Taking? Authorizing Provider  albuterol (PROVENTIL HFA;VENTOLIN HFA) 108 (90 Base) MCG/ACT inhaler Inhale 2 puffs into the lungs every 4 (four) hours as needed for wheezing or shortness of breath (cough). 10/12/16   Street, Mercedes, PA-C  cephALEXin (KEFLEX) 500 MG capsule Take 1 capsule (500 mg total) by mouth 4 (four) times daily. 04/19/17   Triplett, Tammy, PA-C  cyclobenzaprine (FLEXERIL) 10 MG tablet Take 1 tablet (10 mg total) by mouth 3 (three) times daily as needed. 04/19/17   Triplett, Tammy, PA-C  diclofenac (VOLTAREN) 75 MG EC tablet Take 1 tablet (75 mg total) by mouth 2 (two) times daily. Take with food 04/19/17   Triplett, Tammy, PA-C  ibuprofen (ADVIL,MOTRIN) 200 MG tablet Take 400 mg by mouth every 6 (six) hours as needed.    [provider]     Family History No family history on file.  Social History Social History   Tobacco Use  . Smoking status: Current Every Day Smoker    Packs/day: 1.00    Years: 13.00    Pack years: 13.00    Types: Cigarettes  . Smokeless tobacco: Never Used  Substance Use Topics  . Alcohol use: Yes    Comment: socially  . Drug use: No     Allergies   Patient has no known allergies.   Review of Systems Review of Systems ROS: Statement: All systems negative except as marked or noted in the HPI; Constitutional: Negative for fever and chills. ; ; Eyes: Negative for eye pain, redness and discharge. ; ; ENMT: Negative for ear pain, hoarseness, +nasal congestion, sinus pressure and sore throat. ; ; Cardiovascular: Negative for chest pain, palpitations, diaphoresis, dyspnea and peripheral edema. ; ; Respiratory: +cough.  Negative for wheezing and stridor. ; ; Gastrointestinal: Negative for nausea, vomiting, diarrhea, abdominal pain, blood in stool, hematemesis, jaundice and rectal bleeding. . ; ; Genitourinary: Negative for dysuria, flank pain and hematuria. ; ; Musculoskeletal: Negative for back pain and neck pain. Negative for swelling and trauma.; ; Skin: Negative  for pruritus, rash, abrasions, blisters, bruising and skin lesion.; ; Neuro: Negative for headache, lightheadedness and neck stiffness. Negative for weakness, altered level of consciousness, altered mental status, extremity weakness, paresthesias, involuntary movement, seizure and syncope.       Physical Exam Updated Vital Signs BP 134/84   Pulse (!) 103   Temp 98.4 F (36.9 C) (Oral)   Resp 20   Ht 5\' 2"  (1.575 m)   Wt 69.9 kg (154 lb)   LMP 08/25/2017   SpO2 100%   BMI 28.17 kg/m   Physical Exam 1250: Physical examination:  Nursing notes reviewed; Vital signs and O2 SAT reviewed;  Constitutional: Well developed, Well nourished, Well hydrated, In no acute distress; Head:  Normocephalic, atraumatic; Eyes: EOMI, PERRL, No  scleral icterus; ENMT: TM's clear bilat. +edemetous nasal turbinates bilat with clear rhinorrhea. Mouth and pharynx without lesions. No tonsillar exudates. No intra-oral edema. No submandibular or sublingual edema. No hoarse voice, no drooling, no stridor. No pain with manipulation of larynx. No trismus.  Mouth and pharynx normal, Mucous membranes moist; Neck: Supple, Full range of motion, No lymphadenopathy; Cardiovascular: Regular rate and rhythm, No gallop; Respiratory: Breath sounds clear & equal bilaterally, No wheezes.  Speaking full sentences with ease, Normal respiratory effort/excursion; Chest: Nontender, Movement normal; Abdomen: Soft, Nontender, Nondistended, Normal bowel sounds; Genitourinary: No CVA tenderness; Extremities: Pulses normal, No tenderness, No edema, No calf edema or asymmetry.; Neuro: AA&Ox3, Major CN grossly intact.  Speech clear. No gross focal motor or sensory deficits in extremities.; Skin: Color normal, Warm, Dry.   ED Treatments / Results  Labs (all labs ordered are listed, but only abnormal results are displayed)   EKG  EKG Interpretation  Date/Time:  Wednesday September 22 2017 09:57:04 EST Ventricular Rate:  83 PR Interval:  128 QRS Duration: 84 QT Interval:  374 QTC Calculation: 439 R Axis:   39 Text Interpretation:  Normal sinus rhythm Normal ECG When compared with ECG of 12/18/2015 No significant change was found Confirmed by Samuel JesterMcManus, Adilynne Fitzwater [phone]([id number]) on 09/22/2017 12:53:12 PM       Radiology   Procedures Procedures (including critical care time)  Medications Ordered in ED Medications - No data to display   Initial Impression / Assessment and Plan / ED Course  I have reviewed the triage vital signs and the nursing notes.  Pertinent labs & imaging results that were available during my care of the patient were reviewed by me and considered in my medical decision making (see chart for details).  MDM Reviewed: previous chart, nursing note and  vitals Interpretation: labs and x-ray   Results for orders placed or performed during the hospital encounter of 09/22/17  Rapid strep screen  Result Value Ref Range   Streptococcus, Group A Screen (Direct) NEGATIVE NEGATIVE  Urinalysis, Routine w reflex microscopic  Result Value Ref Range   Color, Urine YELLOW YELLOW   APPearance HAZY (A) CLEAR   Specific Gravity, Urine 1.017 1.005 - 1.030   pH 5.0 5.0 - 8.0   Glucose, UA NEGATIVE NEGATIVE mg/dL   Hgb urine dipstick NEGATIVE NEGATIVE   Bilirubin Urine NEGATIVE NEGATIVE   Ketones, ur NEGATIVE NEGATIVE mg/dL   Protein, ur NEGATIVE NEGATIVE mg/dL   Nitrite POSITIVE (A) NEGATIVE   Leukocytes, UA NEGATIVE NEGATIVE   RBC / HPF 0-5 0 - 5 RBC/hpf   WBC, UA 0-5 0 - 5 WBC/hpf   Bacteria, UA FEW (A) NONE SEEN   Squamous Epithelial / LPF 0-5 (A) NONE SEEN  Mucus PRESENT   Pregnancy, urine  Result Value Ref Range   Preg Test, Ur NEGATIVE NEGATIVE   Dg Chest 2 View Result Date: 09/22/2017 CLINICAL DATA:  Fever, body aches and shortness of breath over the last several days. EXAM: CHEST  2 VIEW COMPARISON:  04/19/2017 FINDINGS: Heart size is normal. Mediastinal shadows are normal. The lungs are clear. No bronchial thickening. No infiltrate, mass, effusion or collapse. Pulmonary vascularity is normal. No bony abnormality. IMPRESSION: Normal chest Electronically Signed   By: Paulina FusiMark  Shogry M.D.   On: 09/22/2017 13:15    1400:  Pt no longer in the ED.     Final Clinical Impressions(s) / ED Diagnoses   Final diagnoses:  None    ED Discharge Orders    None       Samuel JesterMcManus, Bailen Geffre, DO 09/24/17 1620

## 2017-09-22 NOTE — ED Notes (Signed)
Pt is requesting to leave, she states her ride is here and she needs to go. She is refusing VS at this time. RN stated she would call pt and leave paperwork at front when the paper work is ready.
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
## 2017-09-24 LAB — CULTURE, GROUP A STREP (THRC)

## 2018-08-31 ENCOUNTER — Encounter: Payer: Self-pay | Admitting: Adult Health

## 2018-09-12 ENCOUNTER — Other Ambulatory Visit: Payer: Self-pay | Admitting: Obstetrics and Gynecology

## 2018-09-12 DIAGNOSIS — O3680X0 Pregnancy with inconclusive fetal viability, not applicable or unspecified: Secondary | ICD-10-CM

## 2018-09-14 ENCOUNTER — Ambulatory Visit (INDEPENDENT_AMBULATORY_CARE_PROVIDER_SITE_OTHER): Payer: PRIVATE HEALTH INSURANCE

## 2018-09-14 DIAGNOSIS — O3680X0 Pregnancy with inconclusive fetal viability, not applicable or unspecified: Secondary | ICD-10-CM | POA: Diagnosis not present

## 2018-09-14 NOTE — Progress Notes (Signed)
US 10+5 wks,single IUP,positive fht 164 bpm,normal ovaries bilat,crl 33.85 mm

## 2018-09-27 ENCOUNTER — Other Ambulatory Visit: Payer: Self-pay | Admitting: Obstetrics & Gynecology

## 2018-09-27 DIAGNOSIS — Z3682 Encounter for antenatal screening for nuchal translucency: Secondary | ICD-10-CM

## 2018-09-28 ENCOUNTER — Encounter: Payer: Self-pay | Admitting: Nurse Practitioner

## 2018-09-28 ENCOUNTER — Encounter: Payer: PRIVATE HEALTH INSURANCE | Admitting: Advanced Practice Midwife

## 2018-09-28 ENCOUNTER — Other Ambulatory Visit: Payer: PRIVATE HEALTH INSURANCE

## 2018-09-28 ENCOUNTER — Ambulatory Visit: Payer: PRIVATE HEALTH INSURANCE | Admitting: *Deleted

## 2019-04-04 ENCOUNTER — Inpatient Hospital Stay (HOSPITAL_COMMUNITY)
Admission: EM | Admit: 2019-04-04 | Discharge: 2019-04-07 | DRG: 917 | Disposition: A | Discharge status: Home / Self Care | Payer: Self-pay | Attending: Internal Medicine | Admitting: Internal Medicine

## 2019-04-04 ENCOUNTER — Encounter (HOSPITAL_COMMUNITY): Payer: Self-pay | Admitting: Emergency Medicine

## 2019-04-04 ENCOUNTER — Emergency Department (HOSPITAL_COMMUNITY): Payer: Self-pay

## 2019-04-04 DIAGNOSIS — J982 Interstitial emphysema: Secondary | ICD-10-CM | POA: Diagnosis present

## 2019-04-04 DIAGNOSIS — G40909 Epilepsy, unspecified, not intractable, without status epilepticus: Secondary | ICD-10-CM | POA: Diagnosis present

## 2019-04-04 DIAGNOSIS — F1110 Opioid abuse, uncomplicated: Secondary | ICD-10-CM

## 2019-04-04 DIAGNOSIS — Y9234 Swimming pool (public) as the place of occurrence of the external cause: Secondary | ICD-10-CM

## 2019-04-04 DIAGNOSIS — J9600 Acute respiratory failure, unspecified whether with hypoxia or hypercapnia: Secondary | ICD-10-CM

## 2019-04-04 DIAGNOSIS — F1510 Other stimulant abuse, uncomplicated: Secondary | ICD-10-CM | POA: Diagnosis present

## 2019-04-04 DIAGNOSIS — Z1159 Encounter for screening for other viral diseases: Secondary | ICD-10-CM

## 2019-04-04 DIAGNOSIS — W16012A Fall into swimming pool striking water surface causing other injury, initial encounter: Secondary | ICD-10-CM

## 2019-04-04 DIAGNOSIS — J9601 Acute respiratory failure with hypoxia: Secondary | ICD-10-CM | POA: Diagnosis present

## 2019-04-04 DIAGNOSIS — W16011A Fall into swimming pool striking water surface causing drowning and submersion, initial encounter: Secondary | ICD-10-CM | POA: Diagnosis present

## 2019-04-04 DIAGNOSIS — F1210 Cannabis abuse, uncomplicated: Secondary | ICD-10-CM | POA: Diagnosis present

## 2019-04-04 DIAGNOSIS — T424X1A Poisoning by benzodiazepines, accidental (unintentional), initial encounter: Secondary | ICD-10-CM | POA: Diagnosis present

## 2019-04-04 DIAGNOSIS — Z79899 Other long term (current) drug therapy: Secondary | ICD-10-CM

## 2019-04-04 DIAGNOSIS — F1920 Other psychoactive substance dependence, uncomplicated: Secondary | ICD-10-CM | POA: Diagnosis present

## 2019-04-04 DIAGNOSIS — F1310 Sedative, hypnotic or anxiolytic abuse, uncomplicated: Secondary | ICD-10-CM | POA: Diagnosis present

## 2019-04-04 DIAGNOSIS — K7200 Acute and subacute hepatic failure without coma: Secondary | ICD-10-CM | POA: Diagnosis present

## 2019-04-04 DIAGNOSIS — J9602 Acute respiratory failure with hypercapnia: Secondary | ICD-10-CM | POA: Diagnosis present

## 2019-04-04 DIAGNOSIS — F17210 Nicotine dependence, cigarettes, uncomplicated: Secondary | ICD-10-CM | POA: Diagnosis present

## 2019-04-04 DIAGNOSIS — T43621A Poisoning by amphetamines, accidental (unintentional), initial encounter: Principal | ICD-10-CM | POA: Diagnosis present

## 2019-04-04 DIAGNOSIS — R739 Hyperglycemia, unspecified: Secondary | ICD-10-CM | POA: Diagnosis present

## 2019-04-04 DIAGNOSIS — T407X1A Poisoning by cannabis (derivatives), accidental (unintentional), initial encounter: Secondary | ICD-10-CM | POA: Diagnosis present

## 2019-04-04 DIAGNOSIS — J690 Pneumonitis due to inhalation of food and vomit: Secondary | ICD-10-CM | POA: Diagnosis present

## 2019-04-04 DIAGNOSIS — S0081XA Abrasion of other part of head, initial encounter: Secondary | ICD-10-CM | POA: Diagnosis present

## 2019-04-04 DIAGNOSIS — G92 Toxic encephalopathy: Secondary | ICD-10-CM | POA: Diagnosis present

## 2019-04-04 DIAGNOSIS — T751XXA Unspecified effects of drowning and nonfatal submersion, initial encounter: Secondary | ICD-10-CM

## 2019-04-04 LAB — COMPREHENSIVE METABOLIC PANEL
ALT: 221 U/L — ABNORMAL HIGH (ref 0–44)
AST: 182 U/L — ABNORMAL HIGH (ref 15–41)
Albumin: 4 g/dL (ref 3.5–5.0)
Alkaline Phosphatase: 71 U/L (ref 38–126)
Anion gap: 9 (ref 5–15)
BUN: 17 mg/dL (ref 6–20)
CO2: 19 mmol/L — ABNORMAL LOW (ref 22–32)
Calcium: 9 mg/dL (ref 8.9–10.3)
Chloride: 110 mmol/L (ref 98–111)
Creatinine, Ser: 1.57 mg/dL — ABNORMAL HIGH (ref 0.44–1.00)
GFR calc Af Amer: 50 mL/min — ABNORMAL LOW (ref >60)
GFR calc non Af Amer: 43 mL/min — ABNORMAL LOW (ref >60)
Glucose, Bld: 227 mg/dL — ABNORMAL HIGH (ref 70–99)
Potassium: 3.9 mmol/L (ref 3.5–5.1)
Sodium: 138 mmol/L (ref 135–145)
Total Bilirubin: 0.4 mg/dL (ref 0.3–1.2)
Total Protein: 6.5 g/dL (ref 6.5–8.1)

## 2019-04-04 LAB — CBC WITH DIFFERENTIAL/PLATELET
Abs Immature Granulocytes: 0.24 10*3/uL — ABNORMAL HIGH (ref 0.00–0.07)
Basophils Absolute: 0.1 10*3/uL (ref 0.0–0.1)
Basophils Relative: 1 %
Eosinophils Absolute: 0.3 10*3/uL (ref 0.0–0.5)
Eosinophils Relative: 3 %
HCT: 45.8 % (ref 36.0–46.0)
Hemoglobin: 14.9 g/dL (ref 12.0–15.0)
Immature Granulocytes: 2 %
Lymphocytes Relative: 41 %
Lymphs Abs: 4.3 10*3/uL — ABNORMAL HIGH (ref 0.7–4.0)
MCH: 31.3 pg (ref 26.0–34.0)
MCHC: 32.5 g/dL (ref 30.0–36.0)
MCV: 96.2 fL (ref 80.0–100.0)
Monocytes Absolute: 0.5 10*3/uL (ref 0.1–1.0)
Monocytes Relative: 5 %
Neutro Abs: 5.1 10*3/uL (ref 1.7–7.7)
Neutrophils Relative %: 48 %
Platelets: 230 10*3/uL (ref 150–400)
RBC: 4.76 MIL/uL (ref 3.87–5.11)
RDW: 12.6 % (ref 11.5–15.5)
WBC: 10.5 10*3/uL (ref 4.0–10.5)
nRBC: 0 % (ref 0.0–0.2)

## 2019-04-04 LAB — RAPID URINE DRUG SCREEN, HOSP PERFORMED
Amphetamines: POSITIVE — AB
Barbiturates: NOT DETECTED
Benzodiazepines: POSITIVE — AB
Cocaine: NOT DETECTED
Opiates: NOT DETECTED
Tetrahydrocannabinol: POSITIVE — AB

## 2019-04-04 LAB — POCT I-STAT 7, (LYTES, BLD GAS, ICA,H+H)
Acid-base deficit: 4 mmol/L — ABNORMAL HIGH (ref 0.0–2.0)
Bicarbonate: 23.5 mmol/L (ref 20.0–28.0)
Calcium, Ion: 1.31 mmol/L (ref 1.15–1.40)
HCT: 45 % (ref 36.0–46.0)
Hemoglobin: 15.3 g/dL — ABNORMAL HIGH (ref 12.0–15.0)
O2 Saturation: 100 %
Patient temperature: 96.7
Potassium: 4.1 mmol/L (ref 3.5–5.1)
Sodium: 138 mmol/L (ref 135–145)
TCO2: 25 mmol/L (ref 22–32)
pCO2 arterial: 49.3 mmHg — ABNORMAL HIGH (ref 32.0–48.0)
pH, Arterial: 7.281 — ABNORMAL LOW (ref 7.350–7.450)
pO2, Arterial: 279 mmHg — ABNORMAL HIGH (ref 83.0–108.0)

## 2019-04-04 LAB — POCT I-STAT EG7
Acid-base deficit: 6 mmol/L — ABNORMAL HIGH (ref 0.0–2.0)
Bicarbonate: 21.3 mmol/L (ref 20.0–28.0)
Calcium, Ion: 1.25 mmol/L (ref 1.15–1.40)
HCT: 47 % — ABNORMAL HIGH (ref 36.0–46.0)
Hemoglobin: 16 g/dL — ABNORMAL HIGH (ref 12.0–15.0)
O2 Saturation: 100 %
Potassium: 3.8 mmol/L (ref 3.5–5.1)
Sodium: 139 mmol/L (ref 135–145)
TCO2: 23 mmol/L (ref 22–32)
pCO2, Ven: 48.4 mmHg (ref 44.0–60.0)
pH, Ven: 7.252 (ref 7.250–7.430)
pO2, Ven: 219 mmHg — ABNORMAL HIGH (ref 32.0–45.0)

## 2019-04-04 LAB — URINALYSIS, ROUTINE W REFLEX MICROSCOPIC
Bilirubin Urine: NEGATIVE
Glucose, UA: NEGATIVE mg/dL
Hgb urine dipstick: NEGATIVE
Ketones, ur: NEGATIVE mg/dL
Leukocytes,Ua: NEGATIVE
Nitrite: NEGATIVE
Protein, ur: 100 mg/dL — AB
Specific Gravity, Urine: 1.026 (ref 1.005–1.030)
pH: 5 (ref 5.0–8.0)

## 2019-04-04 LAB — PROTIME-INR
INR: 1.1 (ref 0.8–1.2)
Prothrombin Time: 13.8 seconds (ref 11.4–15.2)

## 2019-04-04 LAB — LACTIC ACID, PLASMA: Lactic Acid, Venous: 1.8 mmol/L (ref 0.5–1.9)

## 2019-04-04 LAB — SALICYLATE LEVEL: Salicylate Lvl: <7 mg/dL (ref 2.8–30.0)

## 2019-04-04 LAB — ACETAMINOPHEN LEVEL: Acetaminophen (Tylenol), Serum: <10 ug/mL — ABNORMAL LOW (ref 10–30)

## 2019-04-04 LAB — ETHANOL: Alcohol, Ethyl (B): <10 mg/dL (ref <10)

## 2019-04-04 LAB — SARS CORONAVIRUS 2 BY RT PCR (HOSPITAL ORDER, PERFORMED IN ~~LOC~~ HOSPITAL LAB): SARS Coronavirus 2: NEGATIVE

## 2019-04-04 MED ORDER — ETOMIDATE 2 MG/ML IV SOLN
INTRAVENOUS | Status: AC | PRN
  Administered 2019-04-04: 20 mg via INTRAVENOUS

## 2019-04-04 MED ORDER — FAMOTIDINE 40 MG/5ML PO SUSR
20.0000 mg | Freq: Two times a day (BID) | ORAL | Status: DC
  Administered 2019-04-05: 20 mg
  Filled 2019-04-04: qty 2.5

## 2019-04-04 MED ORDER — FENTANYL CITRATE (PF) 100 MCG/2ML IJ SOLN
50.0000 ug | INTRAMUSCULAR | Status: DC | PRN
  Administered 2019-04-05 (×2): 150 ug via INTRAVENOUS
  Administered 2019-04-05 (×2): 100 ug via INTRAVENOUS
  Administered 2019-04-05 (×2): 200 ug via INTRAVENOUS
  Administered 2019-04-05: 08:00:00 100 ug via INTRAVENOUS
  Administered 2019-04-05: 150 ug via INTRAVENOUS
  Administered 2019-04-05: 100 ug via INTRAVENOUS
  Administered 2019-04-05 – 2019-04-06 (×4): 200 ug via INTRAVENOUS
  Filled 2019-04-04 (×3): qty 4
  Filled 2019-04-04: qty 2
  Filled 2019-04-04: qty 4
  Filled 2019-04-04: qty 2
  Filled 2019-04-04 (×2): qty 4
  Filled 2019-04-04: qty 2
  Filled 2019-04-04 (×2): qty 4
  Filled 2019-04-04: qty 2
  Filled 2019-04-04: qty 4

## 2019-04-04 MED ORDER — INSULIN ASPART 100 UNIT/ML ~~LOC~~ SOLN
0.0000 [IU] | SUBCUTANEOUS | Status: DC
  Administered 2019-04-06: 13:00:00 1 [IU] via SUBCUTANEOUS

## 2019-04-04 MED ORDER — FENTANYL CITRATE (PF) 100 MCG/2ML IJ SOLN: 50.0000 ug | INTRAMUSCULAR | Status: DC | PRN

## 2019-04-04 MED ORDER — HEPARIN SODIUM (PORCINE) 5000 UNIT/ML IJ SOLN
5000.0000 [IU] | Freq: Three times a day (TID) | INTRAMUSCULAR | Status: DC
  Administered 2019-04-05 – 2019-04-07 (×6): 5000 [IU] via SUBCUTANEOUS
  Filled 2019-04-04 (×5): qty 1

## 2019-04-04 MED ORDER — PROPOFOL 1000 MG/100ML IV EMUL
5.0000 ug/kg/min | INTRAVENOUS | Status: DC
  Administered 2019-04-04: 10 ug/kg/min via INTRAVENOUS

## 2019-04-04 MED ORDER — SODIUM CHLORIDE 0.9 % IV SOLN
INTRAVENOUS | Status: DC
  Administered 2019-04-05: 01:00:00 via INTRAVENOUS

## 2019-04-04 MED ORDER — PROPOFOL 1000 MG/100ML IV EMUL
5.0000 ug/kg/min | INTRAVENOUS | Status: DC
  Administered 2019-04-04: 25 ug/kg/min via INTRAVENOUS
  Administered 2019-04-04 – 2019-04-05 (×5): 50 ug/kg/min via INTRAVENOUS
  Filled 2019-04-04 (×7): qty 100

## 2019-04-04 MED ORDER — SUCCINYLCHOLINE CHLORIDE 20 MG/ML IJ SOLN
INTRAMUSCULAR | Status: AC | PRN
  Administered 2019-04-04: 120 mg via INTRAVENOUS

## 2019-04-04 MED ORDER — FENTANYL CITRATE (PF) 100 MCG/2ML IJ SOLN
100.0000 ug | Freq: Once | INTRAMUSCULAR | Status: AC
  Administered 2019-04-04: 18:00:00 100 ug via INTRAVENOUS
  Filled 2019-04-04: qty 2

## 2019-04-04 NOTE — ED Notes (Signed)
Patients mother called and would like for someone to give her call back. [name] [phone].
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
## 2019-04-04 NOTE — ED Notes (Signed)
Anna pts sister in law, has pts son. Wants an update on the pt

## 2019-04-04 NOTE — ED Notes (Signed)
Critical Care dr at bedside.
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
## 2019-04-04 NOTE — ED Notes (Signed)
[phone] Shanetta Long pts sister in law, has pts son. Wants an update on the pt  

## 2019-04-04 NOTE — ED Notes (Signed)
Pt's mother at bedside speaking to ED provider.

## 2019-04-05 ENCOUNTER — Inpatient Hospital Stay (HOSPITAL_COMMUNITY): Payer: Self-pay

## 2019-04-05 LAB — GLUCOSE, CAPILLARY
Glucose-Capillary: 102 mg/dL — ABNORMAL HIGH (ref 70–99)
Glucose-Capillary: 91 mg/dL (ref 70–99)
Glucose-Capillary: 93 mg/dL (ref 70–99)
Glucose-Capillary: 89 mg/dL (ref 70–99)
Glucose-Capillary: 72 mg/dL (ref 70–99)

## 2019-04-05 LAB — HEMOGLOBIN A1C
Hgb A1c MFr Bld: 5.4 % (ref 4.8–5.6)
Mean Plasma Glucose: 108.28 mg/dL

## 2019-04-05 LAB — BASIC METABOLIC PANEL
Anion gap: 9 (ref 5–15)
BUN: 16 mg/dL (ref 6–20)
CO2: 22 mmol/L (ref 22–32)
Calcium: 8.7 mg/dL — ABNORMAL LOW (ref 8.9–10.3)
Chloride: 107 mmol/L (ref 98–111)
Creatinine, Ser: 0.93 mg/dL (ref 0.44–1.00)
GFR calc Af Amer: >60 mL/min (ref >60)
GFR calc non Af Amer: >60 mL/min (ref >60)
Glucose, Bld: 109 mg/dL — ABNORMAL HIGH (ref 70–99)
Potassium: 3.9 mmol/L (ref 3.5–5.1)
Sodium: 138 mmol/L (ref 135–145)

## 2019-04-05 LAB — MRSA PCR SCREENING: MRSA by PCR: NEGATIVE

## 2019-04-05 LAB — CBC
HCT: 43.1 % (ref 36.0–46.0)
Hemoglobin: 14 g/dL (ref 12.0–15.0)
MCH: 30.5 pg (ref 26.0–34.0)
MCHC: 32.5 g/dL (ref 30.0–36.0)
MCV: 93.9 fL (ref 80.0–100.0)
Platelets: 141 10*3/uL — ABNORMAL LOW (ref 150–400)
RBC: 4.59 MIL/uL (ref 3.87–5.11)
RDW: 12.7 % (ref 11.5–15.5)
WBC: 11.3 10*3/uL — ABNORMAL HIGH (ref 4.0–10.5)
nRBC: 0 % (ref 0.0–0.2)

## 2019-04-05 LAB — LACTIC ACID, PLASMA: Lactic Acid, Venous: 1.3 mmol/L (ref 0.5–1.9)

## 2019-04-05 LAB — HEPATIC FUNCTION PANEL
ALT: 172 U/L — ABNORMAL HIGH (ref 0–44)
AST: 134 U/L — ABNORMAL HIGH (ref 15–41)
Albumin: 3.6 g/dL (ref 3.5–5.0)
Alkaline Phosphatase: 66 U/L (ref 38–126)
Bilirubin, Direct: 0.2 mg/dL (ref 0.0–0.2)
Indirect Bilirubin: 0.6 mg/dL (ref 0.3–0.9)
Total Bilirubin: 0.8 mg/dL (ref 0.3–1.2)
Total Protein: 6.2 g/dL — ABNORMAL LOW (ref 6.5–8.1)

## 2019-04-05 LAB — TROPONIN I (HIGH SENSITIVITY)
Troponin I (High Sensitivity): 296 ng/L (ref <18)
Troponin I (High Sensitivity): 221 ng/L (ref <18)

## 2019-04-05 LAB — HIV ANTIBODY (ROUTINE TESTING W REFLEX): HIV Screen 4th Generation wRfx: NONREACTIVE

## 2019-04-05 LAB — PHOSPHORUS: Phosphorus: 3.9 mg/dL (ref 2.5–4.6)

## 2019-04-05 LAB — HCG, SERUM, QUALITATIVE: Preg, Serum: NEGATIVE

## 2019-04-05 LAB — MAGNESIUM: Magnesium: 1.9 mg/dL (ref 1.7–2.4)

## 2019-04-05 LAB — PREGNANCY, URINE: Preg Test, Ur: NEGATIVE

## 2019-04-05 MED ORDER — MIDAZOLAM HCL 2 MG/2ML IJ SOLN
INTRAMUSCULAR | Status: AC
  Filled 2019-04-05: qty 2

## 2019-04-05 MED ORDER — DEXTROSE-NACL 5-0.9 % IV SOLN
INTRAVENOUS | Status: DC
  Administered 2019-04-05 – 2019-04-06 (×2): via INTRAVENOUS

## 2019-04-05 MED ORDER — MIDAZOLAM HCL 2 MG/2ML IJ SOLN
2.0000 mg | INTRAMUSCULAR | Status: AC | PRN
  Administered 2019-04-05 – 2019-04-06 (×2): 2 mg via INTRAVENOUS
  Filled 2019-04-05: qty 2

## 2019-04-05 MED ORDER — CHLORHEXIDINE GLUCONATE CLOTH 2 % EX PADS
6.0000 | MEDICATED_PAD | Freq: Every day | CUTANEOUS | Status: DC
  Administered 2019-04-06: 6 via TOPICAL
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
## 2019-04-05 NOTE — Progress Notes (Signed)
Spoke with [facility] CPS. Once extubation date/time is decided, RN is to let CPS know. CPS will need to visit pt at bedside visit as soon as patient is extubated and able to communicate appropriately.   Call [phone] to reach CPS once extubation time is set.  Worthy Keeler, unit director, has been made aware of situation.  Donnetta Simpers, RN

## 2019-04-05 NOTE — Progress Notes (Signed)
[facility] Progress Note Patient Name: [name] DOB: August 10, 1986 MRN: [id number]   Date of Service  04/05/2019  HPI/Events of Note  RN requesting 4 point restraints for extreme agitation  eICU Interventions  On evaluation pt is resting quietly in bed and does not appear disruptive. Will defer 4 point restraints for now.        Letina Luckett U Servando Kyllonen 04/05/2019, 1:00 AM
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
## 2019-04-05 NOTE — ED Notes (Signed)
Pt's mother called and given an update @ [phone]. No answer, left message.
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
## 2019-04-05 NOTE — Progress Notes (Signed)
[facility] Progress Note Patient Name: ADELINE PETITFRERE DOB: Aug 22, 1986 MRN: [id number]   Date of Service  04/05/2019  HPI/Events of Note  Pt with extreme agitation on the ventilator despite Propofol + Fentanyl, she is a self-extubation risk due to agitation  eICU Interventions  4 point soft restraints prn safety and comfort.        Kerry Kass Ogan 04/05/2019, 3:05 AM

## 2019-04-05 NOTE — Progress Notes (Signed)
Transported patient to CT and back without complications.  

## 2019-04-05 NOTE — Progress Notes (Signed)
[facility] Progress Note Patient Name: [name] DOB: 06/13/1986 MRN: [id number]   Date of Service  04/05/2019  HPI/Events of Note  Pt is agitated and is about to go to radiology for a CT scan  eICU Interventions  Versed 2 mg iv q 1 hour prn agitation x 2 doses then d/c        Hue Steveson U Jonn Chaikin 04/05/2019, 9:30 PM
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
## 2019-04-05 NOTE — Progress Notes (Signed)
Received patient from ED.  Patient beginning to wake and has become violent with arms and legs.  Patient now in 4 point restraints and is sedated.  Unable to correctly perform some ICU assessments.  Vitals are wnl.  Patient appears comfortable at this time
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
## 2019-04-06 ENCOUNTER — Inpatient Hospital Stay (HOSPITAL_COMMUNITY): Payer: Self-pay

## 2019-04-06 ENCOUNTER — Encounter (HOSPITAL_COMMUNITY): Payer: Self-pay

## 2019-04-06 DIAGNOSIS — J9600 Acute respiratory failure, unspecified whether with hypoxia or hypercapnia: Secondary | ICD-10-CM

## 2019-04-06 LAB — MAGNESIUM: Magnesium: 1.6 mg/dL — ABNORMAL LOW (ref 1.7–2.4)

## 2019-04-06 LAB — CBC
HCT: 34.2 % — ABNORMAL LOW (ref 36.0–46.0)
Hemoglobin: 11.2 g/dL — ABNORMAL LOW (ref 12.0–15.0)
MCH: 30.7 pg (ref 26.0–34.0)
MCHC: 32.7 g/dL (ref 30.0–36.0)
MCV: 93.7 fL (ref 80.0–100.0)
Platelets: ADEQUATE 10*3/uL (ref 150–400)
RBC: 3.65 MIL/uL — ABNORMAL LOW (ref 3.87–5.11)
RDW: 12.6 % (ref 11.5–15.5)
WBC: 8 10*3/uL (ref 4.0–10.5)
nRBC: 0 % (ref 0.0–0.2)

## 2019-04-06 LAB — COMPREHENSIVE METABOLIC PANEL
ALT: 100 U/L — ABNORMAL HIGH (ref 0–44)
AST: 43 U/L — ABNORMAL HIGH (ref 15–41)
Albumin: 3 g/dL — ABNORMAL LOW (ref 3.5–5.0)
Alkaline Phosphatase: 51 U/L (ref 38–126)
Anion gap: 6 (ref 5–15)
BUN: 7 mg/dL (ref 6–20)
CO2: 20 mmol/L — ABNORMAL LOW (ref 22–32)
Calcium: 8.2 mg/dL — ABNORMAL LOW (ref 8.9–10.3)
Chloride: 113 mmol/L — ABNORMAL HIGH (ref 98–111)
Creatinine, Ser: 0.79 mg/dL (ref 0.44–1.00)
GFR calc Af Amer: >60 mL/min (ref >60)
GFR calc non Af Amer: >60 mL/min (ref >60)
Glucose, Bld: 113 mg/dL — ABNORMAL HIGH (ref 70–99)
Potassium: 3.4 mmol/L — ABNORMAL LOW (ref 3.5–5.1)
Sodium: 139 mmol/L (ref 135–145)
Total Bilirubin: 1.2 mg/dL (ref 0.3–1.2)
Total Protein: 5.3 g/dL — ABNORMAL LOW (ref 6.5–8.1)

## 2019-04-06 LAB — POCT I-STAT 7, (LYTES, BLD GAS, ICA,H+H)
Acid-base deficit: 6 mmol/L — ABNORMAL HIGH (ref 0.0–2.0)
Bicarbonate: 16.7 mmol/L — ABNORMAL LOW (ref 20.0–28.0)
Calcium, Ion: 1.2 mmol/L (ref 1.15–1.40)
HCT: 35 % — ABNORMAL LOW (ref 36.0–46.0)
Hemoglobin: 11.9 g/dL — ABNORMAL LOW (ref 12.0–15.0)
O2 Saturation: 98 %
Patient temperature: 97.4
Potassium: 3.2 mmol/L — ABNORMAL LOW (ref 3.5–5.1)
Sodium: 139 mmol/L (ref 135–145)
TCO2: 17 mmol/L — ABNORMAL LOW (ref 22–32)
pCO2 arterial: 25 mmHg — ABNORMAL LOW (ref 32.0–48.0)
pH, Arterial: 7.429 (ref 7.350–7.450)
pO2, Arterial: 102 mmHg (ref 83.0–108.0)

## 2019-04-06 LAB — GLUCOSE, CAPILLARY
Glucose-Capillary: 92 mg/dL (ref 70–99)
Glucose-Capillary: 97 mg/dL (ref 70–99)
Glucose-Capillary: 121 mg/dL — ABNORMAL HIGH (ref 70–99)

## 2019-04-06 LAB — PHOSPHORUS: Phosphorus: 1.9 mg/dL — ABNORMAL LOW (ref 2.5–4.6)

## 2019-04-06 LAB — TROPONIN I (HIGH SENSITIVITY): Troponin I (High Sensitivity): 24 ng/L — ABNORMAL HIGH (ref <18)

## 2019-04-06 MED ORDER — SODIUM CHLORIDE 0.9 % IV SOLN
3.0000 g | Freq: Four times a day (QID) | INTRAVENOUS | Status: DC
  Administered 2019-04-06 – 2019-04-07 (×5): 3 g via INTRAVENOUS
  Filled 2019-04-06 (×6): qty 3

## 2019-04-06 MED ORDER — POTASSIUM PHOSPHATES 15 MMOLE/5ML IV SOLN
30.0000 mmol | Freq: Once | INTRAVENOUS | Status: AC
  Administered 2019-04-06: 16:00:00 30 mmol via INTRAVENOUS
  Filled 2019-04-06: qty 10

## 2019-04-06 MED ORDER — MAGNESIUM SULFATE 2 GM/50ML IV SOLN
2.0000 g | Freq: Once | INTRAVENOUS | Status: AC
  Administered 2019-04-06: 2 g via INTRAVENOUS
  Filled 2019-04-06: qty 50

## 2019-04-06 MED ORDER — IOHEXOL 300 MG/ML  SOLN
150.0000 mL | Freq: Once | INTRAMUSCULAR | Status: AC | PRN
  Administered 2019-04-06: 14:00:00 25 mL via ORAL

## 2019-04-06 MED ORDER — SODIUM CHLORIDE 0.9 % IV SOLN
INTRAVENOUS | Status: DC | PRN
  Administered 2019-04-06: 12:00:00 250 mL via INTRAVENOUS

## 2019-04-06 NOTE — Progress Notes (Signed)
Took patient to CT.  2mg  versed given before procedure.  Patient did well.  Patient transferred back to unit without incident.  Then patient began kicking and smacking side of bed again.   Medication not lasting long anymore.  Patient continues to be combative while awake.  Fentanyl 239mcg given IVP.  Continuing to closely monitor

## 2019-04-06 NOTE — Procedures (Signed)
Extubation Procedure Note  Patient Details:   Name: [name] DOB: 1986/09/29 MRN: [id number]   Airway Documentation:    Vent end date: 04/06/19 Vent end time: 0943   Evaluation  O2 sats: stable throughout Complications: No apparent complications Patient did tolerate procedure well. Bilateral Breath Sounds: Rhonchi   Patient extubated to 2L [place]. Patient met all parameters prior to extubation, good NIF/VC & positive cuff leak. Patient able to speak & good cough post extubation.  Kathie Dike 04/06/2019, 9:50 AM
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
## 2019-04-07 ENCOUNTER — Inpatient Hospital Stay (HOSPITAL_COMMUNITY): Payer: Self-pay

## 2019-04-07 DIAGNOSIS — T424X1A Poisoning by benzodiazepines, accidental (unintentional), initial encounter: Secondary | ICD-10-CM

## 2019-04-07 DIAGNOSIS — F19230 Other psychoactive substance dependence with withdrawal, uncomplicated: Secondary | ICD-10-CM

## 2019-04-07 LAB — COMPREHENSIVE METABOLIC PANEL
ALT: 69 U/L — ABNORMAL HIGH (ref 0–44)
AST: 24 U/L (ref 15–41)
Albumin: 3.3 g/dL — ABNORMAL LOW (ref 3.5–5.0)
Alkaline Phosphatase: 59 U/L (ref 38–126)
Anion gap: 11 (ref 5–15)
BUN: <5 mg/dL — ABNORMAL LOW (ref 6–20)
CO2: 19 mmol/L — ABNORMAL LOW (ref 22–32)
Calcium: 8.7 mg/dL — ABNORMAL LOW (ref 8.9–10.3)
Chloride: 109 mmol/L (ref 98–111)
Creatinine, Ser: 0.7 mg/dL (ref 0.44–1.00)
GFR calc Af Amer: >60 mL/min (ref >60)
GFR calc non Af Amer: >60 mL/min (ref >60)
Glucose, Bld: 91 mg/dL (ref 70–99)
Potassium: 3.8 mmol/L (ref 3.5–5.1)
Sodium: 139 mmol/L (ref 135–145)
Total Bilirubin: 1.6 mg/dL — ABNORMAL HIGH (ref 0.3–1.2)
Total Protein: 6.2 g/dL — ABNORMAL LOW (ref 6.5–8.1)

## 2019-04-07 LAB — MAGNESIUM: Magnesium: 2 mg/dL (ref 1.7–2.4)

## 2019-04-07 LAB — CBC
HCT: 36.2 % (ref 36.0–46.0)
Hemoglobin: 12.2 g/dL (ref 12.0–15.0)
MCH: 31 pg (ref 26.0–34.0)
MCHC: 33.7 g/dL (ref 30.0–36.0)
MCV: 92.1 fL (ref 80.0–100.0)
Platelets: 130 10*3/uL — ABNORMAL LOW (ref 150–400)
RBC: 3.93 MIL/uL (ref 3.87–5.11)
RDW: 12.1 % (ref 11.5–15.5)
WBC: 6.3 10*3/uL (ref 4.0–10.5)
nRBC: 0 % (ref 0.0–0.2)

## 2019-04-07 LAB — PHOSPHORUS: Phosphorus: 3.6 mg/dL (ref 2.5–4.6)

## 2019-04-07 MED ORDER — AMOXICILLIN-POT CLAVULANATE 875-125 MG PO TABS: 1.0000 | ORAL_TABLET | Freq: Two times a day (BID) | ORAL | 0 refills | Status: AC

## 2019-04-07 MED ORDER — AMOXICILLIN-POT CLAVULANATE 875-125 MG PO TABS: 1.0000 | ORAL_TABLET | Freq: Two times a day (BID) | ORAL | 0 refills | Status: DC

## 2019-04-07 MED ORDER — LORAZEPAM 1 MG PO TABS: 1.0000 mg | ORAL_TABLET | Freq: Four times a day (QID) | ORAL | Status: DC | PRN

## 2019-04-07 MED FILL — AMOX-CLAV 875-125 MG TABLET: 875-125 | 7 days supply | Qty: 14 | Fill #0
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
## 2019-04-07 NOTE — Progress Notes (Signed)
Patient states she is going to leave AMA and that there is no reason for her to stay here. Patient states she will stay and finish her IV abx and then she was leaving. MD made aware and states she will call and talk with the patient.
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
## 2019-04-07 NOTE — Discharge Instructions (Addendum)
Seizure, Adult °A seizure is a sudden burst of abnormal electrical activity in the brain. Seizures usually last from 30 seconds to 2 minutes. They can cause many different symptoms. °Usually, seizures are not harmful unless they last a long time. °What are the causes? °Common causes of this condition include: °· Fever or infection. °· Conditions that affect the brain, such as: °? A brain abnormality that you were born with. °? A brain or head injury. °? Bleeding in the brain. °? A tumor. °? Stroke. °? Brain disorders such as autism or cerebral palsy. °· Low blood sugar. °· Conditions that are passed from parent to child (are inherited). °· Problems with substances, such as: °? Having a reaction to a drug or a medicine. °? Suddenly stopping the use of a substance (withdrawal). °In some cases, the cause may not be known. A person who has repeated seizures over time without a clear cause has a condition called epilepsy. °What increases the risk? °You are more likely to get this condition if you have: °· A family history of epilepsy. °· Had a seizure in the past. °· A brain disorder. °· A history of head injury, lack of oxygen at birth, or strokes. °What are the signs or symptoms? °There are many types of seizures. The symptoms vary depending on the type of seizure you have. Examples of symptoms during a seizure include: °· Shaking (convulsions). °· Stiffness in the body. °· Passing out (losing consciousness). °· Head nodding. °· Staring. °· Not responding to sound or touch. °· Loss of bladder control and bowel control. °Some people have symptoms right before and right after a seizure happens. °Symptoms before a seizure may include: °· Fear. °· Worry (anxiety). °· Feeling like you may vomit (nauseous). °· Feeling like the room is spinning (vertigo). °· Feeling like you saw or heard something before (déjà vu). °· Odd tastes or smells. °· Changes in how you see. You may see flashing lights or spots. °Symptoms after a  seizure happens can include: °· Confusion. °· Sleepiness. °· Headache. °· Weakness on one side of the body. °How is this treated? °Most seizures will stop on their own in under 5 minutes. In these cases, no treatment is needed. Seizures that last longer than 5 minutes will usually need treatment. Treatment can include: °· Medicines given through an IV tube. °· Avoiding things that are known to cause your seizures. These can include medicines that you take for another condition. °· Medicines to treat epilepsy. °· Surgery to stop the seizures. This may be needed if medicines do not help. °Follow these instructions at home: °Medicines °· Take over-the-counter and prescription medicines only as told by your doctor. °· Do not eat or drink anything that may keep your medicine from working, such as alcohol. °Activity °· Do not do any activities that would be dangerous if you had another seizure, like driving or swimming. Wait until your doctor says it is safe for you to do them. °· If you live in the [place] ask your local DMV (department of motor vehicles) when you can drive. °· Get plenty of rest. °Teaching others °Teach friends and family what to do when you have a seizure. They should: °· Lay you on the ground. °· Protect your head and body. °· Loosen any tight clothing around your neck. °· Turn you on your side. °· Not hold you down. °· Not put anything into your mouth. °· Know whether or not you need emergency care. °· Stay   with you until you are better.  General instructions  Contact your doctor each time you have a seizure.  Avoid anything that gives you seizures.  Keep a seizure diary. Write down: ? What you think caused each seizure. ? What you remember about each seizure.  Keep all follow-up visits as told by your doctor. This is important. Contact a doctor if:  You have another seizure.  You have seizures more often.  There is any change in what happens during your seizures.  You keep having  seizures with treatment.  You have symptoms of being sick or having an infection. Get help right away if:  You have a seizure that: ? Lasts longer than 5 minutes. ? Is different than seizures you had before. ? Makes it harder to breathe. ? Happens after you hurt your head.  You have any of these symptoms after a seizure: ? Not being able to speak. ? Not being able to use a part of your body. ? Confusion. ? A bad headache.  You have two or more seizures in a row.  You do not wake up right after a seizure.  You get hurt during a seizure. These symptoms may be an emergency. Do not wait to see if the symptoms will go away. Get medical help right away. Call your local emergency services (911 in the [place] Do not drive yourself to the hospital. Summary  Seizures usually last from 30 seconds to 2 minutes. Usually, they are not harmful unless they last a long time.  Do not eat or drink anything that may keep your medicine from working, such as alcohol.  Teach friends and family what to do when you have a seizure.  Contact your doctor each time you have a seizure. This information is not intended to replace advice given to you by your health care provider. Make sure you discuss any questions you have with your health care provider. Document Released: 03/16/2008 Document Revised: 12/16/2018 Document Reviewed: 12/16/2018 Elsevier Patient Education  [place]. Acute Respiratory Failure, Adult  Acute respiratory failure occurs when there is not enough oxygen passing from your lungs to your body. When this happens, your lungs have trouble removing carbon dioxide from the blood. This causes your blood oxygen level to drop too low as carbon dioxide builds up. Acute respiratory failure is a medical emergency. It can develop quickly, but it is temporary if treated promptly. Your lung capacity, or how much air your lungs can hold, may improve with time, exercise, and treatment. What are  the causes? There are many possible causes of acute respiratory failure, including:  Lung injury.  Chest injury or damage to the ribs or tissues near the lungs.  Lung conditions that affect the flow of air and blood into and out of the lungs, such as pneumonia, acute respiratory distress syndrome, and cystic fibrosis.  Medical conditions, such as strokes or spinal cord injuries, that affect the muscles and nerves that control breathing.  Blood infection (sepsis).  Inflammation of the pancreas (pancreatitis).  A blood clot in the lungs (pulmonary embolism).  A large-volume blood transfusion.  Burns.  Near-drowning.  Seizure.  Smoke inhalation.  Reaction to medicines.  Alcohol or drug overdose. What increases the risk? This condition is more likely to develop in people who have:  A blocked airway.  Asthma.  A condition or disease that damages or weakens the muscles, nerves, bones, or tissues that are involved in breathing.  A serious infection.  A  health problem that blocks the unconscious reflex that is involved in breathing, such as hypothyroidism or sleep apnea.  A lung injury or trauma. What are the signs or symptoms? Trouble breathing is the main symptom of acute respiratory failure. Symptoms may also include:  Rapid breathing.  Restlessness or anxiety.  Skin, lips, or fingernails that appear blue (cyanosis).  Rapid heart rate.  Abnormal heart rhythms (arrhythmias).  Confusion or changes in behavior.  Tiredness or loss of energy.  Feeling sleepy or having a loss of consciousness. How is this diagnosed? Your health care provider can diagnose acute respiratory failure with a medical history and physical exam. During the exam, your health care provider will listen to your heart and check for crackling or wheezing sounds in your lungs. Your may also have tests to confirm the diagnosis and determine what is causing respiratory failure. These tests may  include:  Measuring the amount of oxygen in your blood (pulse oximetry). The measurement comes from a small device that is placed on your finger, earlobe, or toe.  Other blood tests to measure blood gases and to look for signs of infection.  Sampling your cerebral spinal fluid or tracheal fluid to check for infections.  Chest X-ray to look for fluid in spaces that should be filled with air.  Electrocardiogram (ECG) to look at the heart's electrical activity. How is this treated? Treatment for this condition usually takes places in a hospital intensive care unit (ICU). Treatment depends on what is causing the condition. It may include one or more treatments until your symptoms improve. Treatment may include:  Supplemental oxygen. Extra oxygen is given through a tube in the nose, a face mask, or a hood.  A device such as a continuous positive airway pressure (CPAP) or bi-level positive airway pressure (BiPAP or BPAP) machine. This treatment uses mild air pressure to keep the airways open. A mask or other device will be placed over your nose or mouth. A tube that is connected to a motor will deliver oxygen through the mask.  Ventilator. This treatment helps move air into and out of the lungs. This may be done with a bag and mask or a machine. For this treatment, a tube is placed in your windpipe (trachea) so air and oxygen can flow to the lungs.  Extracorporeal membrane oxygenation (ECMO). This treatment temporarily takes over the function of the heart and lungs, supplying oxygen and removing carbon dioxide. ECMO gives the lungs a chance to recover. It may be used if a ventilator is not effective.  Tracheostomy. This is a procedure that creates a hole in the neck to insert a breathing tube.  Receiving fluids and medicines.  Rocking the bed to help breathing. Follow these instructions at home:  Take over-the-counter and prescription medicines only as told by your health care  provider.  Return to normal activities as told by your health care provider. Ask your health care provider what activities are safe for you.  Keep all follow-up visits as told by your health care provider. This is important. How is this prevented? Treating infections and medical conditions that may lead to acute respiratory failure can help prevent the condition from developing. Contact a health care provider if:  You have a fever.  Your symptoms do not improve or they get worse. Get help right away if:  You are having trouble breathing.  You lose consciousness.  Your have cyanosis or turn blue.  You develop a rapid heart rate.  You are  confused. These symptoms may represent a serious problem that is an emergency. Do not wait to see if the symptoms will go away. Get medical help right away. Call your local emergency services (911 in the [place] Do not drive yourself to the hospital. This information is not intended to replace advice given to you by your health care provider. Make sure you discuss any questions you have with your health care provider. Document Released: 10/03/2013 Document Revised: 04/25/2016 Document Reviewed: 04/15/2016 Elsevier Interactive Patient Education  2019 [place].  Aspiration Pneumonia Aspiration pneumonia is an infection in the lungs. It occurs when saliva or liquid contaminated with bacteria is inhaled (aspirated) into the lungs. When these things get into the lungs, swelling (inflammation) and infection can occur. This can make it difficult to breathe. Aspiration pneumonia is a serious condition and can be life threatening. What are the causes? This condition is caused when saliva or liquid from the mouth, throat, or stomach is inhaled into the lungs, and when those fluids are contaminated with bacteria. What increases the risk? The following factors may make you more likely to develop this condition:  A narrowing of the tube that carries food to  the stomach (esophageal narrowing).  Having gastroesophageal reflux disease (GERD).  Having a weak immune system.  Having diabetes.  Having poor oral hygiene.  Being malnourished. The condition is more likely to occur when a person's cough (gag) reflex, or ability to swallow, has decreased. Some things that can cause this decrease include:  Having a brain injury or disease, such as stroke, seizures, Parkinson disease, dementia, or amyotrophic lateral sclerosis (ALS).  Being given a general anesthetic for procedures.  Drinking too much alcohol. If a person passes out and vomits, vomit can be inhaled into the lungs.  Taking certain medicines, such as tranquilizers or sedatives. What are the signs or symptoms? Symptoms of this condition include:  Fever.  A cough with secretions that are yellow, tan, or green.  Breathing problems, such as wheezing or shortness of breath.  Chest pain.  Being more tired than usual (fatigue).  Having a history of coughing while eating or drinking.  Bad breath.  Bluish color to the lips, skin, or fingers. How is this diagnosed? This condition may be diagnosed based on:  A physical exam.  Tests, such as: ? Chest X-ray. ? Sputum culture. Saliva and mucus (sputum) are collected from the lungs or the tubes that carry air to the lungs (bronchi). The sputum is then tested for bacteria. ? Oximetry. A sensor or clip is placed on areas such as a finger, earlobe, or toe to measure the oxygen level in your blood. ? Blood tests. ? Swallowing study. This test looks at how food is swallowed and whether it goes into your breathing tube (trachea) or esophagus. ? Bronchoscopy. This test uses a flexible tube (bronchoscope) to see inside the lungs. How is this treated? This condition may be treated with:  Medicines. Antibiotic medicine will be given to kill the pneumonia bacteria. Other medicines may also be used to reduce fever or pain.  Breathing  assistance and oxygen therapy. Depending on how well you are breathing, you may need to be given oxygen, or you may need breathing support from a breathing machine (ventilator).  Thoracentesis. This is a procedure to remove fluid that has built up in the space between the linings of the chest wall and the lungs.  Feeding tube and diet change. For people who have difficulty swallowing, a feeding  tube might be placed in the stomach, or they may be asked to avoid certain food textures or liquids when eating. Follow these instructions at home: Medicines  Take over-the-counter and prescription medicines only as told by your health care provider. ? If you were prescribed an antibiotic medicine, take it as told by your health care provider. Do not stop taking the antibiotic even if you start to feel better. ? Take cough medicine only if you are losing sleep. Cough medicine can prevent your bodys natural ability to remove mucus from your lungs. General instructions  Carefully follow any eating instructions you were given, such as avoiding certain food textures or thickening your liquids. Thickening liquids reduces the risk of developing aspiration pneumonia again.  Use breathing exercises such as postural drainage, deep breathing, and incentive spirometry to help expel secretions.  Rest as instructed by your health care provider.  Sleep in a semi-upright position at night. Try to sleep in a reclining chair, or place a few pillows under your head.  Do not use any products that contain nicotine or tobacco, such as cigarettes and e-cigarettes. If you need help quitting, ask your health care provider.  Keep all follow-up visits as told by your health care provider. This is important. Contact a health care provider if:  You have a fever.  You have a worsening cough with yellow, tan, or green secretions.  You have coughing while eating or drinking. Get help right away if:  You have worsening  shortness of breath, wheezing, or difficulty breathing.  You have chest pain. Summary  Aspiration pneumonia is an infection in the lungs. It is caused when saliva or liquid from the mouth, throat, or stomach is inhaled into the lungs.  Aspiration pneumonia is more likely to occur when a person's cough reflex or ability to swallow has decreased.  Symptoms of aspiration pneumonia include coughing, breathing problems, fever, and chest pain.  Aspiration pneumonia may be treated with antibiotic medicine, other medicines to reduce pain or fever, and breathing assistance or oxygen therapy. This information is not intended to replace advice given to you by your health care provider. Make sure you discuss any questions you have with your health care provider. Document Released: 07/26/2009 Document Revised: 09/10/2017 Document Reviewed: 11/03/2016 Elsevier Patient Education  2020 [place] how to be safe in and around water makes many enjoyable activities not only fun but also safe. Adults and children are at risk for drowning when swimming or doing activities near water. Accidents or injuries that commonly happen in or around water include:  Death from drowning. This often happens in water that has a strong current, like an ocean or river.  Head and neck injuries from diving into shallow water.  A dangerous drop in body temperature (hypothermia) due to cold water. Hypothermia can lead to confusion, difficulty breathing, and heart failure.  Bites or stings from plants or animals, such as nettle and jellyfish.  Injuries from boating accidents.  Dehydration.  Sunburn. Water safety is important for preventing accidents and injuries whenever you are swimming, boating, or spending time near water. Many water-related accidents can be prevented when you know how to stay safe and you follow a few precautions. What actions can I take to prevent accidents around water? Life  jackets   Always wear a life jacket every time you go boating. Do this no matter what distance you will be traveling, how big your boat is, or how well the  people aboard can swim.  Use a life jacket that is approved by the [place].  Do not use air-filled or foam toys instead of a life jacket. These include water wings, foam noodles, and inner tubes. They are not designed to keep swimmers safe. Swimming safety  Do not swim alone. Never leave children alone near water.  Choose swimming sites that have lifeguards on duty whenever possible.  Take swimming lessons if you do not know how to swim. Make sure children know how to swim.  Do not drink alcohol or use drugs before or during swimming or boating.  Do not dive into water unless it is marked as safe for diving.  Make sure your skills match the type of water where you will be swimming. ? Swimming in the ocean is harder than swimming in a pool. ? If you swim away from land, make sure you have enough energy to swim back to land. ? Know what to do in a very strong current (riptide or rip current).  Do not swim in fast-moving bodies of water, such as river rapids.  Do not swim closer than 100 feet (30 meters) to piers or jetties.  If someone is struggling in the water: ? Call a lifeguard. ? Throw a flotation device to the person, if possible. Flotation devices include life jackets and ring buoys. ? Call emergency services (911 in the [place] if no lifeguard or flotation device is available. Weather and water conditions  Check weather conditions before you go swimming or boating. Do not go swimming or boating when weather conditions may be dangerous, such as during thunderstorms or when there are strong winds.  Follow all warning signs that are posted near the water. If you are at a beach and there is a colored warning flag, make sure you understand what it means.  Watch out for dangerous riptides in the ocean. These may look  like discolored, choppy, or foamy water, and there may be old or broken items (debris) floating nearby. Riptides are most common near [place], jetties, and piers.  If you are caught in a rip current, do not swim toward shore. Swim parallel to the shoreline until you are out of the current, then swim to shore. If you cannot get out of a sandbar, shout and wave to get the attention of someone on shore. General tips   Take a water safety course that includes CPR instruction.  Always have a cell phone, first aid kit, and devices for reaching and throwing (such as ropes, ring buoys, or poles) when you go out on the water.  Put a fence with a self-closing, self-latching gate around home pools. The fence should separate the pool from your house. Keep some reaching and throwing rescue and flotation devices near the pool, and have a first aid kit nearby.  Drink water frequently, even if you are not thirsty. Sun and salt water (as in oceans) can be dehydrating.  Use a water-resistant sunscreen, and follow directions about when to reapply it. Sunburn can be worse in or near water. Where to find more information  American Red Cross: www.redcross.org  National Safety Council: CommodityPost.es  Public affairs consultant of Pediatrics: https://www.patel.info/ Contact a health care provider if:  You get a severe sunburn. Along with red skin, pain, and blisters, a severe sunburn can cause: ? Headache. ? Fever. ? Nausea. ? Dizziness. ? Fatigue. Get help right away if:  Someone is struggling in the water.  Someone is pulled from  the water and is unconscious or not breathing.  You have a severe reaction after being in the water or after being bitten or stung by a plant or animal, such as: ? Swelling of the tongue or throat. ? Difficulty breathing.  You or someone else is shivering due to cold water and is clumsy and confused.  You have chest pain.  You have difficulty breathing. These situations or symptoms may  represent a serious problem that is an emergency. Do not wait to see if the situation gets better or the symptom goes away. Get medical help right away. Call your local emergency services (911 in the [place] Do not drive yourself to the hospital.  Summary  Knowing how to be safe in and around water makes many enjoyable activities not only fun but also safe. Adults and children are at risk for drowning when swimming or doing activities near water.  Always wear a life jacket every time you go boating. Use life jackets that are approved by the [place].  Check weather conditions before you go swimming or boating. Do not go swimming or boating when weather conditions may be dangerous, such as during thunderstorms or when there are strong winds. This information is not intended to replace advice given to you by your health care provider. Make sure you discuss any questions you have with your health care provider. Document Released: 07/30/2004 Document Revised: 04/11/2018 Document Reviewed: 06/16/2017 Elsevier Patient Education  2020 [place].   Pneumomediastinum  Pneumomediastinum is the presence of air in the mediastinum. This is the area of the body that is between the lungs and behind the breastbone. Mild cases of this condition may not cause problems. Severe cases can interfere with the normal functions of your heart and lungs. What are the causes? This condition happens when air leaks out of your lungs, airways, or intestines and into your mediastinum. This condition may be caused by:  Childbirth.  An injury to your chest, lung, intestine, esophagus, or abdomen.  Asthma.  Rapid ascent during scuba diving.  Use of a breathing machine (ventilator).  Inhaling or ingesting certain drugs or chemicals.  An infection in your face, neck, chest, or abdomen.  Extreme strain during coughing or vomiting.  Breathing an object into an airway. This condition can also occur without a  cause. What are the signs or symptoms? Symptoms of this condition include:  Chest pain. The pain may run into your neck, shoulder, back, or arms.  Increased pain when you move, swallow, or take a deep breath.  Problems swallowing.  Problems speaking.  Changes in your voice.  Shortness of breath.  Fever.  Throat or jaw pain. Some people have no symptoms. This is often the case if the condition occurred on its own. How is this diagnosed? This condition may be diagnosed based on:  Your symptoms.  A physical exam.  Imaging tests, such as a chest X-ray or CT scan. How is this treated? Treatment depends on how severe the condition is and whether there are complications.  If your condition is mild, you may not need treatment. Your body may slowly reabsorb the air in your mediastinum. You will stay in the hospital for observation and get medicine for pain, if you have pain.  If your condition is severe, or if the air starts to put pressure on your heart or lungs, you may need: ? Treatment for the underlying cause. ? One or more of the

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
## 2019-04-10 LAB — CULTURE, BLOOD (ROUTINE X 2)
Culture: NO GROWTH
Culture: NO GROWTH
Special Requests: ADEQUATE
Special Requests: ADEQUATE

## 2020-02-28 ENCOUNTER — Emergency Department (HOSPITAL_COMMUNITY): Payer: Self-pay

## 2020-02-28 ENCOUNTER — Emergency Department (HOSPITAL_COMMUNITY)
Admission: EM | Admit: 2020-02-28 | Discharge: 2020-02-28 | Disposition: A | Discharge status: Home / Self Care | Payer: Self-pay | Attending: Emergency Medicine | Admitting: Emergency Medicine

## 2020-02-28 ENCOUNTER — Other Ambulatory Visit: Payer: Self-pay

## 2020-02-28 DIAGNOSIS — Y929 Unspecified place or not applicable: Secondary | ICD-10-CM | POA: Insufficient documentation

## 2020-02-28 DIAGNOSIS — Y999 Unspecified external cause status: Secondary | ICD-10-CM | POA: Insufficient documentation

## 2020-02-28 DIAGNOSIS — S8001XA Contusion of right knee, initial encounter: Secondary | ICD-10-CM | POA: Insufficient documentation

## 2020-02-28 DIAGNOSIS — T50902A Poisoning by unspecified drugs, medicaments and biological substances, intentional self-harm, initial encounter: Secondary | ICD-10-CM

## 2020-02-28 DIAGNOSIS — F10929 Alcohol use, unspecified with intoxication, unspecified: Secondary | ICD-10-CM | POA: Insufficient documentation

## 2020-02-28 DIAGNOSIS — W19XXXA Unspecified fall, initial encounter: Secondary | ICD-10-CM

## 2020-02-28 DIAGNOSIS — F17210 Nicotine dependence, cigarettes, uncomplicated: Secondary | ICD-10-CM | POA: Insufficient documentation

## 2020-02-28 DIAGNOSIS — R4182 Altered mental status, unspecified: Secondary | ICD-10-CM | POA: Insufficient documentation

## 2020-02-28 DIAGNOSIS — Y900 Blood alcohol level of less than 20 mg/100 ml: Secondary | ICD-10-CM | POA: Insufficient documentation

## 2020-02-28 DIAGNOSIS — E162 Hypoglycemia, unspecified: Secondary | ICD-10-CM | POA: Insufficient documentation

## 2020-02-28 DIAGNOSIS — T07XXXA Unspecified multiple injuries, initial encounter: Secondary | ICD-10-CM

## 2020-02-28 DIAGNOSIS — Y9389 Activity, other specified: Secondary | ICD-10-CM | POA: Insufficient documentation

## 2020-02-28 LAB — CBC WITH DIFFERENTIAL/PLATELET
Abs Immature Granulocytes: 0.03 10*3/uL (ref 0.00–0.07)
Basophils Absolute: 0 10*3/uL (ref 0.0–0.1)
Basophils Relative: 0 %
Eosinophils Absolute: 0 10*3/uL (ref 0.0–0.5)
Eosinophils Relative: 1 %
HCT: 39.2 % (ref 36.0–46.0)
Hemoglobin: 13 g/dL (ref 12.0–15.0)
Immature Granulocytes: 0 %
Lymphocytes Relative: 23 %
Lymphs Abs: 1.6 10*3/uL (ref 0.7–4.0)
MCH: 32.7 pg (ref 26.0–34.0)
MCHC: 33.2 g/dL (ref 30.0–36.0)
MCV: 98.5 fL (ref 80.0–100.0)
Monocytes Absolute: 0.5 10*3/uL (ref 0.1–1.0)
Monocytes Relative: 8 %
Neutro Abs: 4.7 10*3/uL (ref 1.7–7.7)
Neutrophils Relative %: 68 %
Platelets: 177 10*3/uL (ref 150–400)
RBC: 3.98 MIL/uL (ref 3.87–5.11)
RDW: 12.4 % (ref 11.5–15.5)
WBC: 7 10*3/uL (ref 4.0–10.5)
nRBC: 0 % (ref 0.0–0.2)

## 2020-02-28 LAB — COMPREHENSIVE METABOLIC PANEL
ALT: 20 U/L (ref 0–44)
AST: 28 U/L (ref 15–41)
Albumin: 4.1 g/dL (ref 3.5–5.0)
Alkaline Phosphatase: 56 U/L (ref 38–126)
Anion gap: 14 (ref 5–15)
BUN: 23 mg/dL — ABNORMAL HIGH (ref 6–20)
CO2: 19 mmol/L — ABNORMAL LOW (ref 22–32)
Calcium: 9 mg/dL (ref 8.9–10.3)
Chloride: 111 mmol/L (ref 98–111)
Creatinine, Ser: 0.91 mg/dL (ref 0.44–1.00)
GFR calc Af Amer: >60 mL/min (ref >60)
GFR calc non Af Amer: >60 mL/min (ref >60)
Glucose, Bld: 57 mg/dL — ABNORMAL LOW (ref 70–99)
Potassium: 3.9 mmol/L (ref 3.5–5.1)
Sodium: 144 mmol/L (ref 135–145)
Total Bilirubin: 1 mg/dL (ref 0.3–1.2)
Total Protein: 6.7 g/dL (ref 6.5–8.1)

## 2020-02-28 LAB — ETHANOL: Alcohol, Ethyl (B): 12 mg/dL — ABNORMAL HIGH (ref <10)

## 2020-02-28 LAB — CBG MONITORING, ED: Glucose-Capillary: 125 mg/dL — ABNORMAL HIGH (ref 70–99)
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
## 2020-02-28 NOTE — ED Notes (Signed)
Pt states that if we do not take the IVs out of her arm she will pull them out and leave. Carmen, NT, at bedside to help remove the pt's IVs. MD made aware.
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
## 2020-02-28 NOTE — ED Notes (Signed)
Patient transported to CT 

## 2020-02-28 NOTE — ED Notes (Signed)
Pt states that she's not able to give urine at this time.

## 2020-02-28 NOTE — ED Notes (Signed)
Pt refusing in and out cath and wants to go home, MD aware, gave orders to feed her and recheck her blood sugar and he will talk to her

## 2020-02-28 NOTE — ED Notes (Signed)
Pt states that she will not wait on the MD, she is leaving AMA right now. I explained that I had already informed him that she wanted to leave, but she says that she cannot wait for him to come talk to her. Pt able to ambulate without help.
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
